# Patient Record
Sex: Male | Born: 1965 | Race: White | Hispanic: No | Smoking: Current every day smoker
Health system: Southern US, Community
[De-identification: ages and names within clinical notes are randomized; demographics above are authoritative.]

## PROBLEM LIST (undated history)

## (undated) DIAGNOSIS — F1910 Other psychoactive substance abuse, uncomplicated: Secondary | ICD-10-CM

## (undated) DIAGNOSIS — K6819 Other retroperitoneal abscess: Secondary | ICD-10-CM

## (undated) DIAGNOSIS — N200 Calculus of kidney: Secondary | ICD-10-CM

## (undated) HISTORY — PX: HERNIA REPAIR: SHX51

---

## 2001-02-16 ENCOUNTER — Encounter: Payer: Self-pay | Admitting: Emergency Medicine

## 2001-02-16 ENCOUNTER — Emergency Department (HOSPITAL_COMMUNITY): Admission: EM | Admit: 2001-02-16 | Discharge: 2001-02-16 | Payer: Self-pay | Admitting: Emergency Medicine

## 2006-11-13 ENCOUNTER — Emergency Department (HOSPITAL_COMMUNITY): Admission: EM | Admit: 2006-11-13 | Discharge: 2006-11-13 | Payer: Self-pay | Admitting: Emergency Medicine

## 2008-08-22 ENCOUNTER — Inpatient Hospital Stay (HOSPITAL_COMMUNITY): Admission: EM | Admit: 2008-08-22 | Discharge: 2008-08-25 | Payer: Self-pay | Admitting: Emergency Medicine

## 2011-03-08 NOTE — Group Therapy Note (Signed)
NAMEABIJAH, ROUSSEL             ACCOUNT NO.:  [id number]   MEDICAL RECORD NO.:  [id number]          PATIENT TYPE:  INP   LOCATION:  A323                          FACILITY:  [facility]   PHYSICIAN:  Skeet Latch, DO    DATE OF BIRTH:  [birth year]   DATE OF PROCEDURE:  08/23/2008  DATE OF DISCHARGE:                                 PROGRESS NOTE   SUBJECTIVE:  Mr. [name] states that he is feeling much better.  The  patient has decreased shortness of breath, decreased body aches, seems  to be doing well at this time.  The patient denies any headache, fever  or chills at this time.   OBJECTIVE:  VITAL SIGNS:  Temperature 97.9, pulse 78, respirations 20,  blood pressure 123/61.  CARDIOVASCULAR:  Regular rate and rhythm.  No murmurs, rubs, or gallops.  His lungs are clear to auscultation bilaterally.  No rales, rhonchi or  wheezing.  His abdomen is soft, nontender, nondistended.  Positive bowel sounds.  No rigidity or guarding.  EXTREMITIES:  No clubbing, cyanosis or edema.   LABS:  Phosphorous is 1.8.  Magnesium is 1.9.  Sodium 135, potassium  4.5, chloride 103, C02 is 27, glucose 150, BUN 11, creatinine 076.   ASSESSMENT AND PLAN:  1. Pneumonia.  Will continue with intravenous antibiotics as well as      breathing treatments and intravenous steroids.  Will try to wean      his steroids as soon as possible.  Awaiting sputum cultures at this      time.  Will get a repeat chest x-ray in the next 24-48 hours.  2. For his leukocytosis, probably secondary to his pneumonia, will      continue to follow closely.  3. For his chronic pain, the patient continues to be on intravenous      pain medications.  These seem be controlling his pain at this time.      Skeet Latch, DO  Electronically Signed     SM/MEDQ  D:  08/23/2008  T:  08/23/2008  Job:  [id number]

## 2011-03-08 NOTE — H&P (Signed)
[name], [name]             ACCOUNT NO.:  [id number]   MEDICAL RECORD NO.:  [id number]          PATIENT TYPE:  INP   LOCATION:  A323                          FACILITY:  [facility]   PHYSICIAN:  Skeet Latch, DO    DATE OF BIRTH:  1966-01-25   DATE OF ADMISSION:  08/22/2008  DATE OF DISCHARGE:  LH                              HISTORY & PHYSICAL   CHIEF COMPLAINT:  Altered mental status and fever.   HISTORY OF PRESENT ILLNESS:  This is a 45 year old Caucasian male who  presents with fever, mental status changes, bodies aches and cough.  The  patient states that last night he became acutely febrile, had some  fatigue, malaise and chills, cough.  The patient states that his  girlfriend states that the  patient had some mental status changes and  decided to bring him in to be evaluated.   PAST MEDICAL HISTORY:  Gout and chronic pain.   PAST SURGICAL HISTORY:  Umbilical hernia repair.   ALLERGIES:  NO KNOWN DRUG ALLERGIES.   PAST FAMILY HISTORY:  Unremarkable.   SOCIAL HISTORY:  He smokes 1 pack a day for over 30 years.  No history  of alcohol or drug use.   HOME MEDICATIONS:  Percocet 4 times a day, indomethacin p.r.n.   REVIEW OF SYSTEMS:  CONSTITUTIONAL:  Positive for fever, chills,  decreased appetite, weakness.  HEENT: Unremarkable.  CARDIOVASCULAR:  No chest pain, palpitations.  RESPIRATORY:  Positive for cough.  No shortness of breath.  GASTROINTESTINAL: No nausea, vomiting, diarrhea, no abdominal pain.  GENITOURINARY:  Unremarkable.  MUSCULOSKELETAL:  Positive for chronic pain.  SKIN:  Unremarkable.  NEUROLOGIC:  Positive for some altered mental status changes.  All other systems are unremarkable.   PHYSICAL EXAMINATION:  VITAL SIGNS: Temperature is 97.9, pulse 78,  respirations 20, blood pressure 123/61.  GENERAL:  He is awake and alert, well-developed, well nourished, well-  hydrated, no acute distress.  HEENT: Head atraumatic, normocephalic.  PERLA.  EOMI.  NECK:  Soft, supple, nontender, nondistended.  CARDIOVASCULAR:  Regular rate and rhythm.  No murmurs, rubs or gallops.  LUNGS:  Clear auscultation bilaterally.  No rales, rhonchi or wheezes.  ABDOMEN:  Soft, nontender, nondistended.  Positive bowel sounds.  No  rigidity or guarding.  EXTREMITIES:  No clubbing, cyanosis or edema.  NEUROLOGIC:  He is alert and oriented x3.  Cranial II-XII are grossly  intact.   LABORATORY DATA:  Phosphorus is 1.8, magnesium 1.9.  Sodium 135,  potassium 4.5, chloride 103, CO2 27, glucose 150, BUN 11, creatinine  0.76.  Influenza A and B screens are negative.  Urinalysis is  unremarkable. White count 18,100, hemoglobin 14.2, hematocrit 41,  platelet count 296.   RADIOLOGIC STUDIES:  Chest x-ray showed bilateral lower lobe infiltrates  right greater than left, compatible with pneumonia.   ASSESSMENT:  1. Pneumonia.  2. History of chronic pain.  3. Flu-like symptoms.   PLAN:  1. The patient will be admitted to the service of [facility], to a      general medical bed.  2. For his pneumonia, the patient be placed  on IV antibiotics.  3. We will obtain sputum cultures and sensitivities.  Also the patient      will placed on IV steroids and will receive nebulizing treatments      round-the-clock.  4. Chronic pain.  Will put the patient on IV pain medications ever 4      hours as needed for pain at this time.  5. The patient will be on DVT as well as GI prophylaxis.      Skeet Latch, DO  Electronically Signed     SM/MEDQ  D:  08/23/2008  T:  08/23/2008  Job:  [id number]

## 2011-03-08 NOTE — Discharge Summary (Signed)
[name], [name]             ACCOUNT NO.:  [id number]   MEDICAL RECORD NO.:  [id number]          PATIENT TYPE:  INP   LOCATION:  A323                          FACILITY:  [facility]   PHYSICIAN:  Skeet Latch, DO    DATE OF BIRTH:  03/11/1966   DATE OF ADMISSION:  08/22/2008  DATE OF DISCHARGE:  11/02/2009LH                               DISCHARGE SUMMARY   DISCHARGE DIAGNOSES:  1. Pneumonia.  2. Leukocytosis, resolving.  3. History of chronic pain.  4. Flu-like symptoms, improved.  5. Septicemia with Gram-positive cocci.   BRIEF HOSPITAL COURSE:  This is a 45 year old Caucasian male who  presented with fever, mental status changes  __________ previous to  being admitted complaining of fatigue, malaise, chills, and cough.  His  girlfriend stated the patient has some mental status changes that  morning and decided to bring the patient in to be evaluated.  The  patient was admitted and found to have bilateral lower lobe infiltrate  right greater than left compatible with pneumonia with chest x-ray.  The  patient was started on IV antibiotics, IV steroids, and received  nebulizing treatments and oxygen via nasal cannula.  The patient was  continued on these medications and has been improving.  The patient's  weakness, fever, chills resolved.  The patient's shortness of breath  also improved.  The patient's repeat chest x-ray done on August 24, 2008, showed improving bilateral airspace infiltrates, questionable  development of infiltrate in the right mid lung.  The patient's Solu-  Medrol was discontinued, started on oral prednisone and Levaquin orally  was added to his daily medications.  At this time, the patient was  stable enough to be discharged to home with close followup with his  primary care physician.   MEDICATIONS ON DISCHARGE:  Include:  1. Percocet 5/325 mg q.6 h. as needed.  2. Indomethacin previous dose as needed for gout attacks.  3. Levaquin 750 mg p.o. for 7  days.  4. Medrol dose pack #1 as directed.  5. Mucinex 600 mg twice a day as needed for cough.   VITALS ON DISCHARGE:  Temperature is 98.2, respirations 20, heart rate  75, and blood pressure 118/69, and sating 98% on room air.   LABORATORY DATA:  Hemoglobin of 12.7, hematocrit 37.8, white count is  11.9, platelets 261,000, sodium 138, potassium 4.2, chloride 102, CO2  27, BUN 8, creatinine 0.78, glucose 140, and calcium 8.6.  Hemoglobin  A1c was 5.6.   CONDITION ON DISCHARGE:  Stable.   DISPOSITION:  The patient will be discharged to home.   DISCHARGE INSTRUCTIONS:  The patient is to maintain a regular diet.  The  patient is to increase his activity slowly.  The patient is to follow up  with Dr. Jorene Guest within 1 week.  The patient may need a followup chest  x-ray in the next 7 to 10 days also.  The patient is to take his  medications as instructed to return to emergency room for any severe  shortness of breath,  __________ call [phone].      Skeet Latch, DO  Electronically Signed     SM/MEDQ  D:  08/25/2008  T:  08/26/2008  Job:  [id number]

## 2011-03-11 NOTE — Discharge Summary (Signed)
[name], [name]             ACCOUNT NO.:  [id number]   MEDICAL RECORD NO.:  [id number]          PATIENT TYPE:  INP   LOCATION:  A323                          FACILITY:  [facility]   PHYSICIAN:  Osvaldo Shipper, MD     DATE OF BIRTH:  12/11/1965   DATE OF ADMISSION:  08/22/2008  DATE OF DISCHARGE:  11/02/2009LH                               DISCHARGE SUMMARY   ADDENDUM:  The patient was discharged recently from [facility] by Dr  Lilian Kapur.   I received a call today that the patient is unable to afford the  Levaquin that was ordered for 7 days.  He was prescribed Levaquin 750 mg  daily for 7 days.  The patient apparently does not have insurance and is  unable to afford this medication, and so he called asking if an  alternative $4 [employer] [facility] prescription can be written.  I went over  the [employer] list and the closest that would come is amoxicillin.  So, I  called in a prescription for amoxicillin 500 mg t.i.d. for 7 days.  The  patient is not allergic to penicillin.      Osvaldo Shipper, MD  Electronically Signed     GK/MEDQ  D:  08/27/2008  T:  08/28/2008  Job:  [id number]

## 2011-03-24 ENCOUNTER — Emergency Department (HOSPITAL_COMMUNITY)
Admission: EM | Admit: 2011-03-24 | Discharge: 2011-03-24 | Disposition: A | Discharge status: Home / Self Care | Payer: Self-pay | Attending: Emergency Medicine | Admitting: Emergency Medicine

## 2011-03-24 DIAGNOSIS — M109 Gout, unspecified: Secondary | ICD-10-CM | POA: Insufficient documentation

## 2011-03-24 DIAGNOSIS — M79609 Pain in unspecified limb: Secondary | ICD-10-CM | POA: Insufficient documentation

## 2011-03-24 DIAGNOSIS — M25519 Pain in unspecified shoulder: Secondary | ICD-10-CM | POA: Insufficient documentation

## 2011-03-24 DIAGNOSIS — F17200 Nicotine dependence, unspecified, uncomplicated: Secondary | ICD-10-CM | POA: Insufficient documentation

## 2011-07-25 LAB — URINALYSIS, ROUTINE W REFLEX MICROSCOPIC
Bilirubin Urine: NEGATIVE
Glucose, UA: NEGATIVE
Hgb urine dipstick: NEGATIVE
Ketones, ur: NEGATIVE
Nitrite: NEGATIVE
Protein, ur: NEGATIVE
Specific Gravity, Urine: 1.01
Urobilinogen, UA: 0.2
pH: 6.5

## 2011-07-25 LAB — BASIC METABOLIC PANEL
BUN: 11
BUN: 16
CO2: 29
Chloride: 100
Chloride: 103
Creatinine, Ser: 0.76
GFR calc non Af Amer: >60
Glucose, Bld: 116 — ABNORMAL HIGH
Glucose, Bld: 150 — ABNORMAL HIGH
Potassium: 4.1
Potassium: 4.5
Sodium: 132 — ABNORMAL LOW

## 2011-07-25 LAB — CBC
HCT: 38.3 — ABNORMAL LOW
HCT: 39.9
HCT: 41
Hemoglobin: 13
Hemoglobin: 13.2
Hemoglobin: 14.2
MCHC: 33.1
MCHC: 33.8
MCHC: 34.5
MCV: 90.8
MCV: 91.2
Platelets: 296
RBC: 4.2 — ABNORMAL LOW
RBC: 4.29
RDW: 13.6
RDW: 13.6
RDW: 13.9

## 2011-07-25 LAB — CULTURE, BLOOD (ROUTINE X 2): Report Status: 11042009

## 2011-07-25 LAB — DIFFERENTIAL
Basophils Absolute: 0
Basophils Absolute: 0
Basophils Relative: 0
Basophils Relative: 0
Basophils Relative: 0
Eosinophils Absolute: 0
Eosinophils Absolute: 0.1
Eosinophils Relative: 0
Eosinophils Relative: 0
Eosinophils Relative: 1
Lymphocytes Relative: 8 — ABNORMAL LOW
Monocytes Absolute: 0.2
Monocytes Absolute: 0.4
Monocytes Absolute: 1.3 — ABNORMAL HIGH
Monocytes Relative: 2 — ABNORMAL LOW
Monocytes Relative: 3
Neutro Abs: 13.2 — ABNORMAL HIGH

## 2011-07-25 LAB — CULTURE, RESPIRATORY W GRAM STAIN

## 2011-07-25 LAB — INFLUENZA A+B VIRUS AG-DIRECT(RAPID): Influenza B Ag: NEGATIVE

## 2011-07-26 LAB — HEMOGLOBIN A1C: Mean Plasma Glucose: 114

## 2011-07-26 LAB — CBC
HCT: 37.8 — ABNORMAL LOW
MCHC: 33.7
MCV: 93
Platelets: 261
Platelets: 281
RDW: 13.9
WBC: 11.9 — ABNORMAL HIGH

## 2011-07-26 LAB — BASIC METABOLIC PANEL
BUN: 10
BUN: 8
CO2: 27
Calcium: 8.7
Chloride: 102
Creatinine, Ser: 0.68
Creatinine, Ser: 0.78
GFR calc non Af Amer: >60
Glucose, Bld: 142 — ABNORMAL HIGH
Potassium: 4.4

## 2011-07-26 LAB — DIFFERENTIAL
Basophils Absolute: 0
Basophils Absolute: 0
Basophils Relative: 0
Eosinophils Absolute: 0
Eosinophils Relative: 0
Lymphocytes Relative: 11 — ABNORMAL LOW
Neutrophils Relative %: 76
Neutrophils Relative %: 82 — ABNORMAL HIGH

## 2011-07-26 LAB — MAGNESIUM: Magnesium: 2

## 2011-07-26 LAB — PHOSPHORUS: Phosphorus: 3.8

## 2011-11-30 ENCOUNTER — Emergency Department (HOSPITAL_COMMUNITY)
Admission: EM | Admit: 2011-11-30 | Discharge: 2011-11-30 | Disposition: A | Discharge status: Home / Self Care | Payer: Self-pay | Attending: Emergency Medicine | Admitting: Emergency Medicine

## 2011-11-30 ENCOUNTER — Encounter (HOSPITAL_COMMUNITY): Payer: Self-pay | Admitting: *Deleted

## 2011-11-30 DIAGNOSIS — M109 Gout, unspecified: Secondary | ICD-10-CM | POA: Insufficient documentation

## 2011-11-30 DIAGNOSIS — M25473 Effusion, unspecified ankle: Secondary | ICD-10-CM | POA: Insufficient documentation

## 2011-11-30 DIAGNOSIS — M79609 Pain in unspecified limb: Secondary | ICD-10-CM | POA: Insufficient documentation

## 2011-11-30 DIAGNOSIS — M7989 Other specified soft tissue disorders: Secondary | ICD-10-CM | POA: Insufficient documentation

## 2011-11-30 DIAGNOSIS — M25476 Effusion, unspecified foot: Secondary | ICD-10-CM | POA: Insufficient documentation

## 2011-11-30 DIAGNOSIS — F17200 Nicotine dependence, unspecified, uncomplicated: Secondary | ICD-10-CM | POA: Insufficient documentation

## 2011-11-30 MED ORDER — OXYCODONE-ACETAMINOPHEN 5-325 MG PO TABS: 1.0000 | ORAL_TABLET | ORAL | Status: AC | PRN

## 2011-11-30 MED ORDER — INDOMETHACIN 25 MG PO CAPS
75.0000 mg | ORAL_CAPSULE | Freq: Once | ORAL | Status: AC
  Administered 2011-11-30: 75 mg via ORAL
  Filled 2011-11-30: qty 3

## 2011-11-30 MED ORDER — OXYCODONE-ACETAMINOPHEN 5-325 MG PO TABS
1.0000 | ORAL_TABLET | Freq: Once | ORAL | Status: AC
  Administered 2011-11-30: 1 via ORAL
  Filled 2011-11-30: qty 1

## 2011-11-30 MED ORDER — INDOMETHACIN ER 75 MG PO CPCR: 75.0000 mg | ORAL_CAPSULE | Freq: Two times a day (BID) | ORAL | Status: AC

## 2011-11-30 NOTE — ED Notes (Signed)
Pt c/o pain and swelling to his left great toe. Pt states that he has gout.

## 2011-11-30 NOTE — ED Notes (Signed)
Pt states gout flare up started  Tuesday night, pt has taken OTC medication without relief.   Pt denies having PCP or medication at home to resolve this condition.  Pt states his left great toe is in severe pain.  Pt reports eating salsa which is his antagonizing agent.

## 2011-11-30 NOTE — ED Provider Notes (Signed)
History     CSN: [id number]  Arrival date & time 11/30/11  1633   First MD Initiated Contact with Patient 11/30/11 1725      Chief Complaint  Patient presents with  . Gout    (Consider location/radiation/quality/duration/timing/severity/associated sxs/prior treatment) HPI Comments: Pain , redness and extreme PT to L 1st MTP for several days.  Identical to prev episodes of gout.  The history is provided by the patient. No language interpreter was used.    Past Medical History  Diagnosis Date  . Gout     Past Surgical History  Procedure Date  . Hernia repair     History reviewed. No pertinent family history.  History  Substance Use Topics  . Smoking status: Current Everyday Smoker    Types: Cigarettes  . Smokeless tobacco: Not on file  . Alcohol Use: No      Review of Systems  Musculoskeletal: Positive for joint swelling.  All other systems reviewed and are negative.    Allergies  Bee venom  Home Medications   Current Outpatient Rx  Name Route Sig Dispense Refill  . ACETAMINOPHEN 500 MG PO TABS Oral Take 1,000 mg by mouth as needed. For pain    . IBUPROFEN 200 MG PO TABS Oral Take 800 mg by mouth as needed. For pain      BP 130/78  Pulse 91  Temp(Src) 98.1 F (36.7 C) (Oral)  Resp 20  Ht 5\' 11"  (1.803 m)  Wt 225 lb (102.059 kg)  BMI 31.38 kg/m2  SpO2 99%  Physical Exam  Nursing note and vitals reviewed. Constitutional: He is oriented to person, place, and time. He appears well-developed and well-nourished.  HENT:  Head: Normocephalic and atraumatic.  Eyes: EOM are normal.  Neck: Normal range of motion.  Cardiovascular: Normal rate, regular rhythm, normal heart sounds and intact distal pulses.   Pulmonary/Chest: Effort normal and breath sounds normal. No respiratory distress.  Abdominal: Soft. He exhibits no distension. There is no tenderness.  Musculoskeletal: He exhibits tenderness.       Left foot: He exhibits decreased range of motion,  tenderness, bony tenderness and swelling.       Feet:  Neurological: He is alert and oriented to person, place, and time.  Skin: Skin is warm and dry.  Psychiatric: He has a normal mood and affect. Judgment normal.    ED Course  Procedures (including critical care time)  Labs Reviewed - No data to display No results found.   No diagnosis found.    MDM          Worthy Rancher, PA 11/30/11 [phone]

## 2011-12-01 NOTE — ED Provider Notes (Signed)
Medical screening examination/treatment/procedure(s) were performed by non-physician practitioner and as supervising physician I was immediately available for consultation/collaboration.  Lelia Jons S. Ala Capri, MD 12/01/11 1506 

## 2012-04-29 ENCOUNTER — Encounter (HOSPITAL_COMMUNITY): Payer: Self-pay

## 2012-04-29 ENCOUNTER — Emergency Department (HOSPITAL_COMMUNITY)
Admission: EM | Admit: 2012-04-29 | Discharge: 2012-04-29 | Disposition: A | Discharge status: Home / Self Care | Payer: Self-pay | Attending: Emergency Medicine | Admitting: Emergency Medicine

## 2012-04-29 DIAGNOSIS — M109 Gout, unspecified: Secondary | ICD-10-CM | POA: Insufficient documentation

## 2012-04-29 DIAGNOSIS — F17200 Nicotine dependence, unspecified, uncomplicated: Secondary | ICD-10-CM | POA: Insufficient documentation

## 2012-04-29 DIAGNOSIS — M10062 Idiopathic gout, left knee: Secondary | ICD-10-CM

## 2012-04-29 MED ORDER — INDOMETHACIN 25 MG PO CAPS
50.0000 mg | ORAL_CAPSULE | Freq: Once | ORAL | Status: AC
  Administered 2012-04-29: 50 mg via ORAL
  Filled 2012-04-29: qty 2

## 2012-04-29 MED ORDER — HYDROCODONE-ACETAMINOPHEN 5-325 MG PO TABS
1.0000 | ORAL_TABLET | Freq: Once | ORAL | Status: AC
  Administered 2012-04-29: 1 via ORAL
  Filled 2012-04-29: qty 1

## 2012-04-29 MED ORDER — HYDROCODONE-ACETAMINOPHEN 5-325 MG PO TABS: 1.0000 | ORAL_TABLET | Freq: Four times a day (QID) | ORAL | Status: AC | PRN

## 2012-04-29 MED ORDER — INDOMETHACIN 50 MG PO CAPS: 50.0000 mg | ORAL_CAPSULE | Freq: Three times a day (TID) | ORAL | Status: DC | PRN

## 2012-04-29 NOTE — ED Notes (Signed)
Pt lying on stretcher sleeping and snoring at this time. Family at bedside.

## 2012-04-29 NOTE — ED Provider Notes (Signed)
Medical screening examination/treatment/procedure(s) were performed by non-physician practitioner and as supervising physician I was immediately available for consultation/collaboration.   Carleene Cooper III, MD 04/29/12 [phone]

## 2012-04-29 NOTE — ED Notes (Signed)
Pt c/o pain in his left knee and great toe since 3 am. Pt has history of gout. Alert and oriented x 3. Skin warm and dry. Color pink. No acute distress.

## 2012-04-29 NOTE — ED Provider Notes (Signed)
History     CSN: [id number]  Arrival date & time 04/29/12  1332   First MD Initiated Contact with Patient 04/29/12 1406      Chief Complaint  Patient presents with  . Gout    (Consider location/radiation/quality/duration/timing/severity/associated sxs/prior treatment) HPI Comments: Pt c/o L knee pain since 0300 today c/w gout.  No fever or chills.  No trauma.  Has had gout in past and states indocin works better than any other meds for him.  The history is provided by the patient. No language interpreter was used.    Past Medical History  Diagnosis Date  . Gout     Past Surgical History  Procedure Date  . Hernia repair     No family history on file.  History  Substance Use Topics  . Smoking status: Current Everyday Smoker    Types: Cigarettes  . Smokeless tobacco: Not on file  . Alcohol Use: No      Review of Systems  Constitutional: Negative for fever and chills.  Musculoskeletal:       Knee pain   All other systems reviewed and are negative.    Allergies  Bee venom  Home Medications   Current Outpatient Rx  Name Route Sig Dispense Refill  . IBUPROFEN 200 MG PO TABS Oral Take 800 mg by mouth as needed. For pain    . ACETAMINOPHEN 500 MG PO TABS Oral Take 1,000 mg by mouth as needed. For pain      BP 128/87  Pulse 78  Temp 98.4 F (36.9 C) (Oral)  Resp 20  Ht 5\' 11"  (1.803 m)  Wt 225 lb (102.059 kg)  BMI 31.38 kg/m2  SpO2 97%  Physical Exam  Nursing note and vitals reviewed. Constitutional: He is oriented to person, place, and time. He appears well-developed and well-nourished.  HENT:  Head: Normocephalic and atraumatic.  Eyes: EOM are normal.  Neck: Normal range of motion.  Cardiovascular: Normal rate, regular rhythm, normal heart sounds and intact distal pulses.   Pulmonary/Chest: Effort normal and breath sounds normal. No respiratory distress.  Abdominal: Soft. He exhibits no distension. There is no tenderness.  Musculoskeletal: He  exhibits tenderness.       Left knee: He exhibits decreased range of motion and erythema. He exhibits no swelling, no ecchymosis and no deformity.       Extreme PT even to light touch.  Neurological: He is alert and oriented to person, place, and time.  Skin: Skin is warm and dry.  Psychiatric: He has a normal mood and affect. Judgment normal.    ED Course  Procedures (including critical care time)  Labs Reviewed - No data to display No results found.   1. Idiopathic gout of left knee       MDM  No trauma.  Typical of previous gouty episodes. Indocin 50 mg TID, 30 rx-hydrocodone, 20         Evalina Field, [place] 04/29/12 1422

## 2012-04-29 NOTE — ED Notes (Signed)
Complain of pain in left knee from gout

## 2012-07-26 ENCOUNTER — Emergency Department (HOSPITAL_COMMUNITY)
Admission: EM | Admit: 2012-07-26 | Discharge: 2012-07-26 | Disposition: A | Discharge status: Home / Self Care | Payer: Self-pay | Attending: Emergency Medicine | Admitting: Emergency Medicine

## 2012-07-26 ENCOUNTER — Encounter (HOSPITAL_COMMUNITY): Payer: Self-pay | Admitting: *Deleted

## 2012-07-26 ENCOUNTER — Emergency Department (HOSPITAL_COMMUNITY): Payer: Self-pay

## 2012-07-26 DIAGNOSIS — M5412 Radiculopathy, cervical region: Secondary | ICD-10-CM | POA: Insufficient documentation

## 2012-07-26 DIAGNOSIS — F17200 Nicotine dependence, unspecified, uncomplicated: Secondary | ICD-10-CM | POA: Insufficient documentation

## 2012-07-26 DIAGNOSIS — M25519 Pain in unspecified shoulder: Secondary | ICD-10-CM | POA: Insufficient documentation

## 2012-07-26 DIAGNOSIS — Z91038 Other insect allergy status: Secondary | ICD-10-CM | POA: Insufficient documentation

## 2012-07-26 MED ORDER — OXYCODONE-ACETAMINOPHEN 5-325 MG PO TABS: 1.0000 | ORAL_TABLET | ORAL | Status: DC | PRN

## 2012-07-26 MED ORDER — OXYCODONE-ACETAMINOPHEN 5-325 MG PO TABS
2.0000 | ORAL_TABLET | Freq: Once | ORAL | Status: AC
  Administered 2012-07-26: 2 via ORAL
  Filled 2012-07-26: qty 2

## 2012-07-26 NOTE — ED Notes (Signed)
Rt shoulder pain, no injury known.

## 2012-07-26 NOTE — ED Provider Notes (Signed)
History     CSN: [id number]  Arrival date & time 07/26/12  1255   First MD Initiated Contact with Patient 07/26/12 1359      Chief Complaint  Patient presents with  . Shoulder Pain     Patient is a 46 y.o. male presenting with shoulder pain. The history is provided by the patient.  Shoulder Pain This is a new problem. The current episode started more than 2 days ago. The problem occurs constantly. The problem has been gradually worsening. Pertinent negatives include no shortness of breath. Exacerbated by: palpation and movement. Nothing relieves the symptoms. He has tried nothing for the symptoms.  pt reports right sided upper chest/right shoulder/back pain for past week Worse with some movement/breathing No fever/cough No weakness No SOB reported No trauma No weakness/numbness reported in right UE No abdominal pain He has never had this before  No redness/swelling to right UE No h/o DVT/PE No recent travel/surgery   Past Medical History  Diagnosis Date  . Gout     Past Surgical History  Procedure Date  . Hernia repair     History reviewed. No pertinent family history.  History  Substance Use Topics  . Smoking status: Current Every Day Smoker    Types: Cigarettes  . Smokeless tobacco: Not on file  . Alcohol Use: Yes     rarely      Review of Systems  Constitutional: Negative for fever.  Respiratory: Negative for shortness of breath.   Neurological: Negative for weakness.  All other systems reviewed and are negative.    Allergies  Bee venom  Home Medications   Current Outpatient Rx  Name Route Sig Dispense Refill  . ACETAMINOPHEN 500 MG PO TABS Oral Take 1,000 mg by mouth as needed. For pain    . IBUPROFEN 200 MG PO TABS Oral Take 800 mg by mouth as needed. For pain    . INDOMETHACIN 50 MG PO CAPS Oral Take 1 capsule (50 mg total) by mouth 3 (three) times daily as needed. 30 capsule 0    BP 125/89  Pulse 96  Temp 98.1 F (36.7 C) (Oral)   Resp 20  Ht 5\' 11"  (1.803 m)  Wt 225 lb (102.059 kg)  BMI 31.38 kg/m2  SpO2 99%  Physical Exam CONSTITUTIONAL: Well developed/well nourished HEAD AND FACE: Normocephalic/atraumatic EYES: EOMI/PERRL ENMT: Mucous membranes moist NECK: supple no meningeal signs SPINE:entire spine nontender.  Cervical paraspinal tenderness noted CV: S1/S2 noted, no murmurs/rubs/gallops noted LUNGS: Lungs are clear to auscultation bilaterally, no apparent distress ABDOMEN: soft, nontender, no rebound or guarding GU:no cva tenderness NEURO: Pt is awake/alert, moves all extremitiesx4. Equal power with hand grip, wrist flex/extension, elbow flex/extension, and equal power with shoulder abduction/adduction.  No focal sensory deficit is noted in either UE.   EXTREMITIES: pulses normal, full ROM, no erythema, no deformity of right shoulder he can range the shoulder.  He has some pain with movement of shoulder.  No signs of trauma No LE edema/erythema and no calf tenderness SKIN: warm, color normal PSYCH: no abnormalities of mood noted  ED Course  Procedures   Pt improved He reports most of his pain starts in right neck and radiates into right UE.  He reports that is most severe pain He has no weakness in his UE, and denies weakness at home Suspect cervical radiculopathy  MDM  Nursing notes including past medical history and social history reviewed and considered in documentation xrays reviewed and considered  Date: 07/26/2012  Rate: 90  Rhythm: normal sinus rhythm  QRS Axis: normal  Intervals: normal  ST/T Wave abnormalities: nonspecific ST changes  Conduction Disutrbances:first-degree A-V block   Narrative Interpretation:   Old EKG Reviewed: none available at time of interpretation    Joya Gaskins, MD 07/26/12 1622

## 2012-12-22 DIAGNOSIS — K6819 Other retroperitoneal abscess: Secondary | ICD-10-CM

## 2012-12-22 HISTORY — DX: Other retroperitoneal abscess: K68.19

## 2013-01-03 ENCOUNTER — Encounter (HOSPITAL_COMMUNITY): Payer: Self-pay | Admitting: *Deleted

## 2013-01-03 ENCOUNTER — Emergency Department (HOSPITAL_COMMUNITY): Payer: Self-pay

## 2013-01-03 ENCOUNTER — Ambulatory Visit (HOSPITAL_COMMUNITY)
Admit: 2013-01-03 | Discharge: 2013-01-03 | Disposition: A | Discharge status: Home / Self Care | Payer: Self-pay | Source: Ambulatory Visit | Attending: General Surgery | Admitting: General Surgery

## 2013-01-03 ENCOUNTER — Inpatient Hospital Stay (HOSPITAL_COMMUNITY)
Admission: EM | Admit: 2013-01-03 | Discharge: 2013-01-07 | DRG: 372 | Disposition: A | Discharge status: Home / Self Care | Payer: MEDICAID | Attending: General Surgery | Admitting: General Surgery

## 2013-01-03 DIAGNOSIS — N1330 Unspecified hydronephrosis: Secondary | ICD-10-CM | POA: Diagnosis present

## 2013-01-03 DIAGNOSIS — F17200 Nicotine dependence, unspecified, uncomplicated: Secondary | ICD-10-CM | POA: Diagnosis present

## 2013-01-03 DIAGNOSIS — K6812 Psoas muscle abscess: Secondary | ICD-10-CM | POA: Insufficient documentation

## 2013-01-03 DIAGNOSIS — M109 Gout, unspecified: Secondary | ICD-10-CM | POA: Diagnosis present

## 2013-01-03 DIAGNOSIS — K6819 Other retroperitoneal abscess: Secondary | ICD-10-CM

## 2013-01-03 LAB — CBC WITH DIFFERENTIAL/PLATELET
Basophils Absolute: 0 K/uL (ref 0.0–0.1)
Basophils Relative: 0 % (ref 0–1)
Eosinophils Absolute: 0.1 K/uL (ref 0.0–0.7)
Eosinophils Relative: 0 % (ref 0–5)
HCT: 30.6 % — ABNORMAL LOW (ref 39.0–52.0)
Hemoglobin: 10.2 g/dL — ABNORMAL LOW (ref 13.0–17.0)
Lymphocytes Relative: 12 % (ref 12–46)
Lymphs Abs: 2.4 K/uL (ref 0.7–4.0)
MCH: 27.6 pg (ref 26.0–34.0)
MCHC: 33.3 g/dL (ref 30.0–36.0)
MCV: 82.9 fL (ref 78.0–100.0)
Monocytes Absolute: 1.7 K/uL — ABNORMAL HIGH (ref 0.1–1.0)
Monocytes Relative: 9 % (ref 3–12)
Neutro Abs: 16.2 K/uL — ABNORMAL HIGH (ref 1.7–7.7)
Neutrophils Relative %: 79 % — ABNORMAL HIGH (ref 43–77)
Platelets: 713 K/uL — ABNORMAL HIGH (ref 150–400)
RBC: 3.69 MIL/uL — ABNORMAL LOW (ref 4.22–5.81)
RDW: 15.5 % (ref 11.5–15.5)
WBC: 20.4 K/uL — ABNORMAL HIGH (ref 4.0–10.5)

## 2013-01-03 LAB — BASIC METABOLIC PANEL WITH GFR
BUN: 7 mg/dL (ref 6–23)
CO2: 23 meq/L (ref 19–32)
Calcium: 8.6 mg/dL (ref 8.4–10.5)
Chloride: 95 meq/L — ABNORMAL LOW (ref 96–112)
Creatinine, Ser: 0.94 mg/dL (ref 0.50–1.35)
GFR calc Af Amer: >90 mL/min
GFR calc non Af Amer: >90 mL/min
Glucose, Bld: 146 mg/dL — ABNORMAL HIGH (ref 70–99)
Potassium: 3.2 meq/L — ABNORMAL LOW (ref 3.5–5.1)
Sodium: 133 meq/L — ABNORMAL LOW (ref 135–145)

## 2013-01-03 LAB — TYPE AND SCREEN: Antibody Screen: NEGATIVE

## 2013-01-03 LAB — HEPATIC FUNCTION PANEL
ALT: 35 U/L (ref 0–53)
AST: 58 U/L — ABNORMAL HIGH (ref 0–37)
Albumin: 1.9 g/dL — ABNORMAL LOW (ref 3.5–5.2)
Alkaline Phosphatase: 98 U/L (ref 39–117)
Bilirubin, Direct: <0.1 mg/dL (ref 0.0–0.3)
Total Bilirubin: 0.2 mg/dL — ABNORMAL LOW (ref 0.3–1.2)
Total Protein: 7.7 g/dL (ref 6.0–8.3)

## 2013-01-03 LAB — URINALYSIS, ROUTINE W REFLEX MICROSCOPIC
Bilirubin Urine: NEGATIVE
Glucose, UA: NEGATIVE mg/dL
Ketones, ur: NEGATIVE mg/dL
Leukocytes, UA: NEGATIVE
Nitrite: NEGATIVE
Specific Gravity, Urine: 1.015 (ref 1.005–1.030)
Urobilinogen, UA: 0.2 mg/dL (ref 0.0–1.0)
pH: 6 (ref 5.0–8.0)

## 2013-01-03 LAB — PROTIME-INR
INR: 1.22 (ref 0.00–1.49)
Prothrombin Time: 15.2 s (ref 11.6–15.2)

## 2013-01-03 LAB — APTT: aPTT: 41 s — ABNORMAL HIGH (ref 24–37)

## 2013-01-03 MED ORDER — ACETAMINOPHEN 325 MG PO TABS
650.0000 mg | ORAL_TABLET | Freq: Four times a day (QID) | ORAL | Status: DC | PRN
  Administered 2013-01-03: 650 mg via ORAL
  Filled 2013-01-03: qty 2

## 2013-01-03 MED ORDER — FENTANYL CITRATE 0.05 MG/ML IJ SOLN
INTRAMUSCULAR | Status: AC
  Filled 2013-01-03: qty 4

## 2013-01-03 MED ORDER — FENTANYL CITRATE 0.05 MG/ML IJ SOLN
INTRAMUSCULAR | Status: AC | PRN
  Administered 2013-01-03 (×3): 50 ug via INTRAVENOUS

## 2013-01-03 MED ORDER — HYDROCODONE-ACETAMINOPHEN 5-325 MG PO TABS
1.0000 | ORAL_TABLET | ORAL | Status: DC | PRN
  Administered 2013-01-04 – 2013-01-05 (×4): 2 via ORAL
  Filled 2013-01-03 (×4): qty 2

## 2013-01-03 MED ORDER — ERTAPENEM SODIUM 1 G IJ SOLR
1.0000 g | INTRAMUSCULAR | Status: DC
  Filled 2013-01-03 (×3): qty 1

## 2013-01-03 MED ORDER — IOHEXOL 300 MG/ML  SOLN
100.0000 mL | Freq: Once | INTRAMUSCULAR | Status: AC | PRN
  Administered 2013-01-03: 100 mL via INTRAVENOUS

## 2013-01-03 MED ORDER — HYDROMORPHONE HCL PF 1 MG/ML IJ SOLN
INTRAMUSCULAR | Status: AC
  Administered 2013-01-03: 1 mg
  Filled 2013-01-03: qty 1

## 2013-01-03 MED ORDER — HYDROMORPHONE HCL PF 1 MG/ML IJ SOLN
1.0000 mg | INTRAMUSCULAR | Status: DC | PRN
  Administered 2013-01-03: 2 mg via INTRAVENOUS
  Administered 2013-01-03 (×2): 1 mg via INTRAVENOUS
  Administered 2013-01-04 – 2013-01-07 (×26): 2 mg via INTRAVENOUS
  Filled 2013-01-03: qty 2
  Filled 2013-01-03: qty 1
  Filled 2013-01-03 (×12): qty 2
  Filled 2013-01-03: qty 1
  Filled 2013-01-03 (×3): qty 2
  Filled 2013-01-03: qty 1
  Filled 2013-01-03: qty 2
  Filled 2013-01-03: qty 1
  Filled 2013-01-03 (×10): qty 2

## 2013-01-03 MED ORDER — DOCUSATE SODIUM 100 MG PO CAPS
100.0000 mg | ORAL_CAPSULE | Freq: Two times a day (BID) | ORAL | Status: DC
  Administered 2013-01-03 – 2013-01-07 (×8): 100 mg via ORAL
  Filled 2013-01-03 (×12): qty 1

## 2013-01-03 MED ORDER — HYDROMORPHONE HCL PF 1 MG/ML IJ SOLN
1.0000 mg | Freq: Once | INTRAMUSCULAR | Status: AC
  Administered 2013-01-03: 1 mg via INTRAVENOUS
  Filled 2013-01-03: qty 1

## 2013-01-03 MED ORDER — ERTAPENEM SODIUM 1 G IJ SOLR: 1.0000 g | INTRAMUSCULAR | Status: DC

## 2013-01-03 MED ORDER — ONDANSETRON HCL 4 MG/2ML IJ SOLN
4.0000 mg | Freq: Four times a day (QID) | INTRAMUSCULAR | Status: DC | PRN
  Administered 2013-01-03: 4 mg via INTRAVENOUS
  Filled 2013-01-03: qty 2

## 2013-01-03 MED ORDER — FENTANYL CITRATE 0.05 MG/ML IJ SOLN
INTRAMUSCULAR | Status: AC
  Administered 2013-01-03: 100 ug via INTRAVENOUS
  Filled 2013-01-03: qty 2

## 2013-01-03 MED ORDER — ONDANSETRON HCL 4 MG/2ML IJ SOLN
4.0000 mg | Freq: Once | INTRAMUSCULAR | Status: AC
  Administered 2013-01-03: 4 mg via INTRAVENOUS
  Filled 2013-01-03: qty 2

## 2013-01-03 MED ORDER — PANTOPRAZOLE SODIUM 40 MG IV SOLR
40.0000 mg | Freq: Every day | INTRAVENOUS | Status: DC
  Administered 2013-01-03 – 2013-01-05 (×3): 40 mg via INTRAVENOUS
  Filled 2013-01-03 (×3): qty 40

## 2013-01-03 MED ORDER — FENTANYL CITRATE 0.05 MG/ML IJ SOLN
100.0000 ug | Freq: Once | INTRAMUSCULAR | Status: AC
  Administered 2013-01-03: 100 ug via INTRAVENOUS

## 2013-01-03 MED ORDER — MIDAZOLAM HCL 2 MG/2ML IJ SOLN
INTRAMUSCULAR | Status: AC | PRN
  Administered 2013-01-03: 2 mg via INTRAVENOUS
  Administered 2013-01-03: 1 mg via INTRAVENOUS

## 2013-01-03 MED ORDER — LACTATED RINGERS IV SOLN
INTRAVENOUS | Status: DC
  Administered 2013-01-03: 13:00:00 via INTRAVENOUS

## 2013-01-03 MED ORDER — FENTANYL CITRATE 0.05 MG/ML IJ SOLN
100.0000 ug | Freq: Once | INTRAMUSCULAR | Status: AC
  Administered 2013-01-03: 100 ug via INTRAVENOUS
  Filled 2013-01-03: qty 2

## 2013-01-03 MED ORDER — SODIUM CHLORIDE 0.9 % IV SOLN
Freq: Once | INTRAVENOUS | Status: AC
  Administered 2013-01-03: 08:00:00 via INTRAVENOUS

## 2013-01-03 MED ORDER — MIDAZOLAM HCL 2 MG/2ML IJ SOLN
INTRAMUSCULAR | Status: AC
  Filled 2013-01-03: qty 4

## 2013-01-03 NOTE — ED Notes (Signed)
Began hurting yesterday. This morning he woke up w/severe R flank and groin pain w/hematuria.

## 2013-01-03 NOTE — Procedures (Signed)
CT 41f retroperit perc abscess drain placement 35ml purulent out, sample for GS, C&S No complication No blood loss. See complete dictation in [facility].

## 2013-01-03 NOTE — ED Notes (Signed)
Report given to [employer] in radiology department at [facility],

## 2013-01-03 NOTE — ED Notes (Signed)
Pt continues to roll around on bed c/o pain, states that the previous pain medication did not help at all. Hobson PA notified, additional orders given

## 2013-01-03 NOTE — ED Notes (Signed)
Report given to [facility] on third floor,

## 2013-01-03 NOTE — ED Notes (Signed)
[facility] here to transport pt. °

## 2013-01-03 NOTE — ED Notes (Signed)
Pt resting with eyes closed. resp even and non labored.

## 2013-01-03 NOTE — ED Notes (Addendum)
Contacted ct who advised that they would get pt as soon as possible,  Hobson, PA and pt updated. Pt requesting additional pain medication, [facility]son PA notified, additional orders given

## 2013-01-03 NOTE — Progress Notes (Signed)
Aware of request for IR to perc drain retroperitoneal abscess. Images reviewed with Dr. Deanne Coffer and agrees that collection is amenable to perc drainage. Communicated with Dr. Leticia Penna. Will try to have [facility] bring pt down here to [facility] for procedure. Labs reviewed, all ok. Pt has been NPO since early this am. Will see and consent upon arrival.

## 2013-01-03 NOTE — ED Notes (Signed)
See paper cobra form for signature.

## 2013-01-03 NOTE — ED Notes (Signed)
Hobson PA in room with pt

## 2013-01-03 NOTE — H&P (Signed)
Referring Physician:Dr. Leticia Penna HPI: [name] is an 47 y.o. male who presented to [facility] with c/o back and groin pain. His workup finds a large right psoas abscess with associated rt hydronephrosis. IR was asked to eval for possible drainage. He has now been transported to [facility] hospital for CT guided perc drainage. PMHx and meds reviewed.  Past Medical History:  Past Medical History  Diagnosis Date  . Gout     Past Surgical History:  Past Surgical History  Procedure Laterality Date  . Hernia repair      Family History: No family history on file.  Social History:  reports that he has been smoking Cigarettes.  He has been smoking about 0.00 packs per day. He does not have any smokeless tobacco history on file. He reports that  drinks alcohol. He reports that he does not use illicit drugs.  Allergies:  Allergies  Allergen Reactions  . Bee Venom Shortness Of Breath and Swelling    Medications: None  Please HPI for pertinent positives, otherwise complete 10 system ROS negative.  Physical Exam: Blood pressure 129/97, pulse 89, temperature 98.9 F (37.2 C), temperature source Oral, resp. rate 20, height 5\' 11"  (1.803 m), weight 220 lb (99.791 kg), SpO2 97.00%. Body mass index is 30.7 kg/(m^2).   General Appearance:  Alert, cooperative, appears in mod-severe distress/pain  Head:  Normocephalic, without obvious abnormality, atraumatic  ENT: Unremarkable  Neck: Supple, symmetrical, trachea midline.  Lungs:   Clear to auscultation bilaterally, no w/r/r, respirations unlabored without use of accessory muscles.  Heart:  Regular rate and rhythm, S1, S2 normal, no murmur, rub or gallop.   Abdomen:   Soft, non-tender, non distended. Tender right abdomen and flank.   Neurologic: Normal affect, no gross deficits.   Results for orders placed during the hospital encounter of 01/03/13 (from the past 48 hour(s))  URINALYSIS, ROUTINE W REFLEX MICROSCOPIC     Status:  Abnormal   Collection Time    01/03/13  8:08 AM      Result Value Range   Color, Urine YELLOW  YELLOW   APPearance CLEAR  CLEAR   Specific Gravity, Urine 1.015  1.005 - 1.030   pH 6.0  5.0 - 8.0   Glucose, UA NEGATIVE  NEGATIVE mg/dL   Hgb urine dipstick LARGE (*) NEGATIVE   Bilirubin Urine NEGATIVE  NEGATIVE   Ketones, ur NEGATIVE  NEGATIVE mg/dL   Protein, ur TRACE (*) NEGATIVE mg/dL   Urobilinogen, UA 0.2  0.0 - 1.0 mg/dL   Nitrite NEGATIVE  NEGATIVE   Leukocytes, UA NEGATIVE  NEGATIVE  URINE MICROSCOPIC-ADD ON     Status: Abnormal   Collection Time    01/03/13  8:08 AM      Result Value Range   RBC / HPF TOO NUMEROUS TO COUNT  <3 RBC/hpf   Bacteria, UA FEW (*) RARE  CBC WITH DIFFERENTIAL     Status: Abnormal   Collection Time    01/03/13  9:22 AM      Result Value Range   WBC 20.4 (*) 4.0 - 10.5 K/uL   RBC 3.69 (*) 4.22 - 5.81 MIL/uL   Hemoglobin 10.2 (*) 13.0 - 17.0 g/dL   HCT 16.1 (*) 09.6 - 04.5 %   MCV 82.9  78.0 - 100.0 fL   MCH 27.6  26.0 - 34.0 pg   MCHC 33.3  30.0 - 36.0 g/dL   RDW 40.9  81.1 - 91.4 %   Platelets 713 (*)  150 - 400 K/uL   Neutrophils Relative 79 (*) 43 - 77 %   Neutro Abs 16.2 (*) 1.7 - 7.7 K/uL   Lymphocytes Relative 12  12 - 46 %   Lymphs Abs 2.4  0.7 - 4.0 K/uL   Monocytes Relative 9  3 - 12 %   Monocytes Absolute 1.7 (*) 0.1 - 1.0 K/uL   Eosinophils Relative 0  0 - 5 %   Eosinophils Absolute 0.1  0.0 - 0.7 K/uL   Basophils Relative 0  0 - 1 %   Basophils Absolute 0.0  0.0 - 0.1 K/uL  BASIC METABOLIC PANEL     Status: Abnormal   Collection Time    01/03/13  9:22 AM      Result Value Range   Sodium 133 (*) 135 - 145 mEq/L   Potassium 3.2 (*) 3.5 - 5.1 mEq/L   Chloride 95 (*) 96 - 112 mEq/L   CO2 23  19 - 32 mEq/L   Glucose, Bld 146 (*) 70 - 99 mg/dL   BUN 7  6 - 23 mg/dL   Creatinine, Ser 7.82  0.50 - 1.35 mg/dL   Calcium 8.6  8.4 - 95.6 mg/dL   GFR calc non Af Amer >90  >90 mL/min   GFR calc Af Amer >90  >90 mL/min   Comment:             The eGFR has been calculated     using the CKD EPI equation.     This calculation has not been     validated in all clinical     situations.     eGFR's persistently     <90 mL/min signify     possible Chronic Kidney Disease.  HEPATIC FUNCTION PANEL     Status: Abnormal   Collection Time    01/03/13  9:22 AM      Result Value Range   Total Protein 7.7  6.0 - 8.3 g/dL   Albumin 1.9 (*) 3.5 - 5.2 g/dL   AST 58 (*) 0 - 37 U/L   ALT 35  0 - 53 U/L   Alkaline Phosphatase 98  39 - 117 U/L   Total Bilirubin 0.2 (*) 0.3 - 1.2 mg/dL   Bilirubin, Direct <2.1  0.0 - 0.3 mg/dL   Indirect Bilirubin NOT CALCULATED  0.3 - 0.9 mg/dL  PROTIME-INR     Status: None   Collection Time    01/03/13 10:06 AM      Result Value Range   Prothrombin Time 15.2  11.6 - 15.2 seconds   INR 1.22  0.00 - 1.49  APTT     Status: Abnormal   Collection Time    01/03/13 10:06 AM      Result Value Range   aPTT 41 (*) 24 - 37 seconds   Comment:            IF BASELINE aPTT IS ELEVATED,     SUGGEST PATIENT RISK ASSESSMENT     BE USED TO DETERMINE APPROPRIATE     ANTICOAGULANT THERAPY.  TYPE AND SCREEN     Status: None   Collection Time    01/03/13 10:25 AM      Result Value Range   ABO/RH(D) O NEG     Antibody Screen NEG     Sample Expiration 01/06/2013     Ct Abdomen Pelvis Wo Contrast  01/03/2013  *RADIOLOGY REPORT*  Clinical Data: Right flank pain.  CT ABDOMEN AND PELVIS  WITHOUT CONTRAST  Technique:  Multidetector CT imaging of the abdomen and pelvis was performed following the standard protocol without intravenous contrast.  Comparison: None.  Findings: Linear densities in the lung bases, likely atelectasis. No effusions.  Heart is upper limits normal in size.  Liver, gallbladder, spleen, pancreas, adrenals have an unremarkable unenhanced appearance.  There is enlargement and heterogeneity throughout the right psoas muscle.  Extensive stranding around the right psoas muscle.  This could represent and  right retroperitoneal hematoma or abscess. This has mass effect on the right kidney, displacing the kidney anteriorly.  There is mild right hydronephrosis, likely related to mass effect and inflammation of the proximal right ureter.  12 mm nonobstructing stone in the mid pole of the left kidney. Left ureters decompressed.  Urinary bladder is unremarkable.  Aorta is normal caliber.  Appendix is visualized and is normal.  Large and small bowel grossly unremarkable.  No free fluid, free air or adenopathy.  No acute bony abnormality.  IMPRESSION: Enlargement of the right psoas muscle with extensive surrounding stranding.  The findings most likely represent retroperitoneal hematoma.  Abscess could have a similar appearance.  Secondary mild right hydronephrosis.  Left nephrolithiasis with a 12 mm nonobstructing mid pole stone.  These results were discussed with Dr. Clarene Duke at the time of interpretation.   Original Report Authenticated By: Charlett Nose, M.D.    Ct Abdomen Pelvis W Contrast  01/03/2013  *RADIOLOGY REPORT*  Clinical Data: Right flank pain.  Abnormal CT done the same day.  CT ABDOMEN AND PELVIS WITH CONTRAST  Technique:  Multidetector CT imaging of the abdomen and pelvis was performed following the standard protocol during bolus administration of intravenous contrast.  Contrast: OMNIPAQUE IOHEXOL 300 MG/ML  SOLN  Comparison: 01/03/2013.  Findings: Lung bases show no acute findings.  Heart size normal. No pericardial effusion.  Esophageal and juxtadiaphragmatic lymph nodes measure up to 10 mm.  Small hiatal hernia.  No pleural effusion.  Liver, gallbladder and adrenal glands are unremarkable.  There is moderate right hydronephrosis.  The right ureter is followed to the level of a heterogeneous and septated low attenuation collection in the right psoas muscle, measuring 9.0 x 8.7 cm. Cranial caudal extent measures approximate 15.8 cm.  Distal ureter appears decompressed with periureteric stranding.  A  1.4 cm stone is seen in the left kidney.  Left ureter may be minimally dilated but is not overtly hydronephrotic.  Spleen, pancreas, stomach and bowel are unremarkable.  Appendix is normal. No pathologically enlarged lymph nodes.  No free fluid.  No worrisome lytic or sclerotic lesions.  IMPRESSION:  1.  Inflammatory mass occupying the right psoas muscle is most consistent with an abscess. 2.  Moderate to severe right hydronephrosis presumably secondary to #1. 3.  Left renal stone.   Original Report Authenticated By: Leanna Battles, M.D.     Assessment/Plan Rt retroperitoneal/psoas abscess with associated hydronephrosis Discussed CT guided perc drainage, including risks, complications, and drain care afterwards. Also discussed use of sedation. Hx of IV drug use, he may require high doses of sedation meds. Labs reviewed, ok. Consent signed in chart  Brayton El PA-C 01/03/2013, 2:29 PM

## 2013-01-03 NOTE — ED Notes (Signed)
Patient is resting comfortably. 

## 2013-01-03 NOTE — ED Notes (Signed)
Patient denies pain and is resting comfortably.  

## 2013-01-03 NOTE — ED Notes (Signed)
Dr. Caesar Bookman at bedside, ,

## 2013-01-03 NOTE — ED Notes (Signed)
Pt states that his pain is "some" better but pt continues to roll around on stretcher, moaning and yelling out in pain, [place] PA notified, additional orders given

## 2013-01-03 NOTE — H&P (Signed)
[name] is an 47 y.o. male.   Chief Complaint: Back and groin pain. HPI: Patient presented to [facility] ED this AM after awakening with severe back and groin pain.  Pain radiates to Right groin and scrotum.  Noted hematuria this AM.  Some dysuria over the last week.  No odor.  Some diarrhea over the last week.  No melena or hematochezia.  Some nausea with pain but no emesis.  Questionable chills through week but patient did not take his temperature.  States prior IV drug abuse history but nothing over the last year.  States "scars/wound" on right forearm is from recent diaphoresis that he underwent to donate plasma.  No known sick contacts.  No history of colon or abdominal issues.  No trauma.  No vascular history.    Past Medical History  Diagnosis Date  . Gout     Past Surgical History  Procedure Laterality Date  . Hernia repair      No family history on file. Social History:  reports that he has been smoking Cigarettes.  He has been smoking about 0.00 packs per day. He does not have any smokeless tobacco history on file. He reports that  drinks alcohol. He reports that he does not use illicit drugs.  Allergies:  Allergies  Allergen Reactions  . Bee Venom Shortness Of Breath and Swelling     (Not in a hospital admission)  Results for orders placed during the hospital encounter of 01/03/13 (from the past 48 hour(s))  URINALYSIS, ROUTINE W REFLEX MICROSCOPIC     Status: Abnormal   Collection Time    01/03/13  8:08 AM      Result Value Range   Color, Urine YELLOW  YELLOW   APPearance CLEAR  CLEAR   Specific Gravity, Urine 1.015  1.005 - 1.030   pH 6.0  5.0 - 8.0   Glucose, UA NEGATIVE  NEGATIVE mg/dL   Hgb urine dipstick LARGE (*) NEGATIVE   Bilirubin Urine NEGATIVE  NEGATIVE   Ketones, ur NEGATIVE  NEGATIVE mg/dL   Protein, ur TRACE (*) NEGATIVE mg/dL   Urobilinogen, UA 0.2  0.0 - 1.0 mg/dL   Nitrite NEGATIVE  NEGATIVE   Leukocytes, UA NEGATIVE  NEGATIVE  URINE  MICROSCOPIC-ADD ON     Status: Abnormal   Collection Time    01/03/13  8:08 AM      Result Value Range   RBC / HPF TOO NUMEROUS TO COUNT  <3 RBC/hpf   Bacteria, UA FEW (*) RARE  CBC WITH DIFFERENTIAL     Status: Abnormal   Collection Time    01/03/13  9:22 AM      Result Value Range   WBC 20.4 (*) 4.0 - 10.5 K/uL   RBC 3.69 (*) 4.22 - 5.81 MIL/uL   Hemoglobin 10.2 (*) 13.0 - 17.0 g/dL   HCT 16.1 (*) 09.6 - 04.5 %   MCV 82.9  78.0 - 100.0 fL   MCH 27.6  26.0 - 34.0 pg   MCHC 33.3  30.0 - 36.0 g/dL   RDW 40.9  81.1 - 91.4 %   Platelets 713 (*) 150 - 400 K/uL   Neutrophils Relative 79 (*) 43 - 77 %   Neutro Abs 16.2 (*) 1.7 - 7.7 K/uL   Lymphocytes Relative 12  12 - 46 %   Lymphs Abs 2.4  0.7 - 4.0 K/uL   Monocytes Relative 9  3 - 12 %   Monocytes Absolute 1.7 (*) 0.1 -  1.0 K/uL   Eosinophils Relative 0  0 - 5 %   Eosinophils Absolute 0.1  0.0 - 0.7 K/uL   Basophils Relative 0  0 - 1 %   Basophils Absolute 0.0  0.0 - 0.1 K/uL  BASIC METABOLIC PANEL     Status: Abnormal   Collection Time    01/03/13  9:22 AM      Result Value Range   Sodium 133 (*) 135 - 145 mEq/L   Potassium 3.2 (*) 3.5 - 5.1 mEq/L   Chloride 95 (*) 96 - 112 mEq/L   CO2 23  19 - 32 mEq/L   Glucose, Bld 146 (*) 70 - 99 mg/dL   BUN 7  6 - 23 mg/dL   Creatinine, Ser 9.60  0.50 - 1.35 mg/dL   Calcium 8.6  8.4 - 45.4 mg/dL   GFR calc non Af Amer >90  >90 mL/min   GFR calc Af Amer >90  >90 mL/min   Comment:            The eGFR has been calculated     using the CKD EPI equation.     This calculation has not been     validated in all clinical     situations.     eGFR's persistently     <90 mL/min signify     possible Chronic Kidney Disease.  HEPATIC FUNCTION PANEL     Status: Abnormal   Collection Time    01/03/13  9:22 AM      Result Value Range   Total Protein 7.7  6.0 - 8.3 g/dL   Albumin 1.9 (*) 3.5 - 5.2 g/dL   AST 58 (*) 0 - 37 U/L   ALT 35  0 - 53 U/L   Alkaline Phosphatase 98  39 - 117 U/L    Total Bilirubin 0.2 (*) 0.3 - 1.2 mg/dL   Bilirubin, Direct <0.9  0.0 - 0.3 mg/dL   Indirect Bilirubin NOT CALCULATED  0.3 - 0.9 mg/dL  PROTIME-INR     Status: None   Collection Time    01/03/13 10:06 AM      Result Value Range   Prothrombin Time 15.2  11.6 - 15.2 seconds   INR 1.22  0.00 - 1.49  APTT     Status: Abnormal   Collection Time    01/03/13 10:06 AM      Result Value Range   aPTT 41 (*) 24 - 37 seconds   Comment:            IF BASELINE aPTT IS ELEVATED,     SUGGEST PATIENT RISK ASSESSMENT     BE USED TO DETERMINE APPROPRIATE     ANTICOAGULANT THERAPY.  TYPE AND SCREEN     Status: None   Collection Time    01/03/13 10:25 AM      Result Value Range   ABO/RH(D) O NEG     Antibody Screen NEG     Sample Expiration 01/06/2013     Ct Abdomen Pelvis Wo Contrast  01/03/2013  *RADIOLOGY REPORT*  Clinical Data: Right flank pain.  CT ABDOMEN AND PELVIS WITHOUT CONTRAST  Technique:  Multidetector CT imaging of the abdomen and pelvis was performed following the standard protocol without intravenous contrast.  Comparison: None.  Findings: Linear densities in the lung bases, likely atelectasis. No effusions.  Heart is upper limits normal in size.  Liver, gallbladder, spleen, pancreas, adrenals have an unremarkable unenhanced appearance.  There is enlargement and heterogeneity  throughout the right psoas muscle.  Extensive stranding around the right psoas muscle.  This could represent and right retroperitoneal hematoma or abscess. This has mass effect on the right kidney, displacing the kidney anteriorly.  There is mild right hydronephrosis, likely related to mass effect and inflammation of the proximal right ureter.  12 mm nonobstructing stone in the mid pole of the left kidney. Left ureters decompressed.  Urinary bladder is unremarkable.  Aorta is normal caliber.  Appendix is visualized and is normal.  Large and small bowel grossly unremarkable.  No free fluid, free air or adenopathy.  No  acute bony abnormality.  IMPRESSION: Enlargement of the right psoas muscle with extensive surrounding stranding.  The findings most likely represent retroperitoneal hematoma.  Abscess could have a similar appearance.  Secondary mild right hydronephrosis.  Left nephrolithiasis with a 12 mm nonobstructing mid pole stone.  These results were discussed with Dr. Clarene Duke at the time of interpretation.   Original Report Authenticated By: Charlett Nose, M.D.    Ct Abdomen Pelvis W Contrast  01/03/2013  *RADIOLOGY REPORT*  Clinical Data: Right flank pain.  Abnormal CT done the same day.  CT ABDOMEN AND PELVIS WITH CONTRAST  Technique:  Multidetector CT imaging of the abdomen and pelvis was performed following the standard protocol during bolus administration of intravenous contrast.  Contrast: OMNIPAQUE IOHEXOL 300 MG/ML  SOLN  Comparison: 01/03/2013.  Findings: Lung bases show no acute findings.  Heart size normal. No pericardial effusion.  Esophageal and juxtadiaphragmatic lymph nodes measure up to 10 mm.  Small hiatal hernia.  No pleural effusion.  Liver, gallbladder and adrenal glands are unremarkable.  There is moderate right hydronephrosis.  The right ureter is followed to the level of a heterogeneous and septated low attenuation collection in the right psoas muscle, measuring 9.0 x 8.7 cm. Cranial caudal extent measures approximate 15.8 cm.  Distal ureter appears decompressed with periureteric stranding.  A 1.4 cm stone is seen in the left kidney.  Left ureter may be minimally dilated but is not overtly hydronephrotic.  Spleen, pancreas, stomach and bowel are unremarkable.  Appendix is normal. No pathologically enlarged lymph nodes.  No free fluid.  No worrisome lytic or sclerotic lesions.  IMPRESSION:  1.  Inflammatory mass occupying the right psoas muscle is most consistent with an abscess. 2.  Moderate to severe right hydronephrosis presumably secondary to #1. 3.  Left renal stone.   Original Report  Authenticated By: Leanna Battles, M.D.     Review of Systems  Constitutional: Positive for chills, malaise/fatigue and diaphoresis. Negative for fever and weight loss.  HENT: Negative.   Eyes: Negative.   Respiratory: Negative.   Cardiovascular: Negative.   Gastrointestinal: Positive for diarrhea. Negative for heartburn, nausea, vomiting and blood in stool.  Genitourinary: Positive for dysuria and hematuria.  Musculoskeletal: Positive for back pain.  Skin: Negative.   Neurological: Negative.  Negative for weakness.  Endo/Heme/Allergies: Negative.   Psychiatric/Behavioral: Negative.     Blood pressure 120/72, pulse 79, temperature 98.7 F (37.1 C), temperature source Oral, resp. rate 20, height 5\' 11"  (1.803 m), weight 99.791 kg (220 lb), SpO2 97.00%. Physical Exam  Constitutional: He is oriented to person, place, and time. He appears well-developed.  Uncomfortable.  Trashing in bed.  "can't get comfortable"  Slightly disheveled  HENT:  Head: Normocephalic and atraumatic.  Eyes: Conjunctivae and EOM are normal. Pupils are equal, round, and reactive to light. No scleral icterus.  Neck: Normal range of motion. Neck supple.  No tracheal deviation present. No thyromegaly present.  Cardiovascular: Regular rhythm.   Tachy rate  Respiratory: Effort normal and breath sounds normal. No respiratory distress.  GI: Soft. He exhibits no distension and no mass. There is no tenderness. There is no rebound and no guarding.  Musculoskeletal: He exhibits tenderness (Right back and flank).  Lymphadenopathy:    He has no cervical adenopathy.  Neurological: He is alert and oriented to person, place, and time.  Skin: Skin is warm and dry.  Thickened skin and inflammatory changes both anticubital regions.  Tattoo right arm.       Assessment/Plan Right retroperitoneal abscess.  Discussed with patient findings.  No clear primary source for the abscess however given history, urinary, colonic, or blood  bourne would all be potential sources.  Even if patient not currently using IV drugs, he may have valve vegetations as a source and will likely benefit from echo once abscess addressed.  Patient will likely benefit from IR drainage although surgical indications have been discussed with patient.  Will continue IV fluid.  IV abx.  Pain control.  Will hold DVT prophylaxis until IR has evaluated and hopefully treated.  Patient understands.   ZIEGLER,BRENT C 01/03/2013, 12:40 PM

## 2013-01-03 NOTE — ED Notes (Signed)
Pt states that the pain is starting to return,  

## 2013-01-03 NOTE — ED Provider Notes (Signed)
History     CSN: [id number]  Arrival date & time 01/03/13  4098   First MD Initiated Contact with Patient 01/03/13 [phone]      Chief Complaint  Patient presents with  . Groin Pain  . Flank Pain  . Hematuria    (Consider location/radiation/quality/duration/timing/severity/associated sxs/prior treatment) Patient is a 47 y.o. male presenting with flank pain and hematuria. The history is provided by the patient.  Flank Pain This is a new problem. The current episode started yesterday. The problem occurs constantly. The problem has been gradually worsening (No hx of trauma.). Associated symptoms include abdominal pain, arthralgias and nausea. Pertinent negatives include no chest pain, coughing, fever or neck pain. The symptoms are aggravated by bending (change of position). He has tried nothing for the symptoms. The treatment provided no relief.  Hematuria Associated symptoms include abdominal pain, arthralgias and nausea. Pertinent negatives include no chest pain, coughing, fever or neck pain.    Past Medical History  Diagnosis Date  . Gout     Past Surgical History  Procedure Laterality Date  . Hernia repair      No family history on file.  History  Substance Use Topics  . Smoking status: Current Every Day Smoker    Types: Cigarettes  . Smokeless tobacco: Not on file  . Alcohol Use: Yes     Comment: rarely      Review of Systems  Constitutional: Negative for fever and activity change.       All ROS Neg except as noted in HPI  HENT: Negative for nosebleeds and neck pain.   Eyes: Negative for photophobia and discharge.  Respiratory: Negative for cough, shortness of breath and wheezing.   Cardiovascular: Negative for chest pain and palpitations.  Gastrointestinal: Positive for nausea and abdominal pain. Negative for blood in stool.  Genitourinary: Positive for dysuria, hematuria and flank pain. Negative for frequency.  Musculoskeletal: Positive for arthralgias.  Negative for back pain.  Skin: Negative.   Neurological: Negative for dizziness, seizures and speech difficulty.  Psychiatric/Behavioral: Negative for hallucinations and confusion.    Allergies  Bee venom  Home Medications   Current Outpatient Rx  Name  Route  Sig  Dispense  Refill  . oxyCODONE-acetaminophen (PERCOCET/ROXICET) 5-325 MG per tablet   Oral   Take 1 tablet by mouth every 4 (four) hours as needed for pain.   15 tablet   0     BP 131/74  Pulse 114  Temp(Src) 98.7 F (37.1 C) (Oral)  Resp 22  Ht 5\' 11"  (1.803 m)  Wt 220 lb (99.791 kg)  BMI 30.7 kg/m2  SpO2 98%  Physical Exam  Nursing note and vitals reviewed. Constitutional: He is oriented to person, place, and time. He appears well-developed and well-nourished.  Non-toxic appearance. He appears ill. He appears distressed.  HENT:  Head: Normocephalic.  Right Ear: Tympanic membrane and external ear normal.  Left Ear: Tympanic membrane and external ear normal.  Eyes: EOM and lids are normal. Pupils are equal, round, and reactive to light.  Neck: Normal range of motion. Neck supple. Carotid bruit is not present.  Cardiovascular: Regular rhythm, normal heart sounds, intact distal pulses and normal pulses.  Tachycardia present.   Pulmonary/Chest: Breath sounds normal. No respiratory distress.  Abdominal: Soft. Bowel sounds are normal. There is tenderness.  Right cvat. Right lower quad tenderness to palpation.  Musculoskeletal: Normal range of motion.  Lymphadenopathy:       Head (right side): No submandibular adenopathy present.  Head (left side): No submandibular adenopathy present.    He has no cervical adenopathy.  Neurological: He is alert and oriented to person, place, and time. He has normal strength. No cranial nerve deficit or sensory deficit.  Skin: Skin is warm and dry.  Psychiatric: He has a normal mood and affect. His speech is normal.    ED Course  Procedures (including critical care  time)  Labs Reviewed  URINALYSIS, ROUTINE W REFLEX MICROSCOPIC   No results found.   No diagnosis found.    MDM  Pulst ox 98% on room air. WNL by my interpretation. Pt examined and in some distress.  UA reveals TMTC rbc's and large bacteria. Old CT of 2002 reviewed, pt had a small density in the kidneys at that time.   Pt requiring more pain medication. Seems very uncomfortable.   CT scan results called by radiologist. Pt has enlargement throughout the right psoas muscle on the right. Right retroperitoneal hematoma or abscess suspected. The right kidney is being displaced anteriorly. No right side kidney stones noted. 12mm nonobstructing stone on the left.  Case discuss with Dr Leticia Penna. IV contrast CT ordered and type and screen.   IV contrast CT reveals a collection that extends caudally and measures 9 x 8.7 cm. The distal ureter is decompressed on the right with stranding. The inflammatory mass involving the right psoas is most likely related to an abscess.  Dr. Leticia Penna is in the emergency department and will assume care for this patient. Patient has been made aware of the CT findings.    Kathie Dike, PA-C 01/03/13 1236

## 2013-01-03 NOTE — ED Notes (Signed)
Pt presents to er with c/o right flank pain that radiates around to right lower quad abd area, blood in urine, pt states that he started "aching" in his flank and back area yesterday, pain became worse during the night, did hear a "drop" in the toilet when he went to use the bathroom.

## 2013-01-04 DIAGNOSIS — I517 Cardiomegaly: Secondary | ICD-10-CM

## 2013-01-04 LAB — BASIC METABOLIC PANEL
Chloride: 96 mEq/L (ref 96–112)
GFR calc Af Amer: >90 mL/min (ref >90)
GFR calc non Af Amer: 87 mL/min — ABNORMAL LOW (ref >90)
Potassium: 3.6 mEq/L (ref 3.5–5.1)
Sodium: 131 mEq/L — ABNORMAL LOW (ref 135–145)

## 2013-01-04 LAB — CBC
HCT: 29.2 % — ABNORMAL LOW (ref 39.0–52.0)
Hemoglobin: 9.7 g/dL — ABNORMAL LOW (ref 13.0–17.0)
MCHC: 33.2 g/dL (ref 30.0–36.0)
RBC: 3.51 MIL/uL — ABNORMAL LOW (ref 4.22–5.81)
WBC: 18.9 10*3/uL — ABNORMAL HIGH (ref 4.0–10.5)

## 2013-01-04 MED ORDER — PROMETHAZINE HCL 25 MG/ML IJ SOLN
12.5000 mg | Freq: Four times a day (QID) | INTRAMUSCULAR | Status: DC | PRN
  Administered 2013-01-04: 12.5 mg via INTRAVENOUS
  Administered 2013-01-04: 25 mg via INTRAVENOUS
  Administered 2013-01-04: 12.5 mg via INTRAVENOUS
  Administered 2013-01-05 – 2013-01-06 (×5): 25 mg via INTRAVENOUS
  Administered 2013-01-07: 12.5 mg via INTRAVENOUS
  Filled 2013-01-04 (×9): qty 1

## 2013-01-04 MED ORDER — SODIUM CHLORIDE 0.9 % IJ SOLN
5.0000 mL | Freq: Three times a day (TID) | INTRAMUSCULAR | Status: DC
  Administered 2013-01-04 – 2013-01-07 (×10): 5 mL

## 2013-01-04 MED ORDER — ERTAPENEM SODIUM 1 G IJ SOLR
1.0000 g | INTRAMUSCULAR | Status: DC
  Administered 2013-01-04 – 2013-01-06 (×3): 1 g via INTRAVENOUS
  Filled 2013-01-04 (×3): qty 1

## 2013-01-04 NOTE — Progress Notes (Signed)
Subjective: Sore.  Some nausea  Objective: Vital signs in last 24 hours: Temp:  [97.6 F (36.4 C)-101.4 F (38.6 C)] 101.4 F (38.6 C) (03/14 1400) Pulse Rate:  [93-100] 93 (03/14 1400) Resp:  [18] 18 (03/14 1400) BP: (115-124)/(63-68) 115/63 mmHg (03/14 1400) SpO2:  [93 %-96 %] 93 % (03/14 1400) Last BM Date: 01/03/13  Intake/Output from previous day: 03/13 0701 - 03/14 0700 In: 5  Out: 450 [Drains:450] Intake/Output this shift:    General appearance: alert and no distress GI: soft, non-tender; bowel sounds normal; no masses,  no organomegaly  Lab Results:   Recent Labs  01/03/13 0922 01/04/13 0507  WBC 20.4* 18.9*  HGB 10.2* 9.7*  HCT 30.6* 29.2*  PLT 713* 643*   BMET  Recent Labs  01/03/13 0922 01/04/13 0507  NA 133* 131*  K 3.2* 3.6  CL 95* 96  CO2 23 25  GLUCOSE 146* 103*  BUN 7 6  CREATININE 0.94 1.01  CALCIUM 8.6 8.0*   PT/INR  Recent Labs  01/03/13 1006  LABPROT 15.2  INR 1.22   ABG No results found for this basename: PHART, PCO2, PO2, HCO3,  in the last 72 hours  Studies/Results: Ct Abdomen Pelvis Wo Contrast  01/03/2013  *RADIOLOGY REPORT*  Clinical Data: Right flank pain.  CT ABDOMEN AND PELVIS WITHOUT CONTRAST  Technique:  Multidetector CT imaging of the abdomen and pelvis was performed following the standard protocol without intravenous contrast.  Comparison: None.  Findings: Linear densities in the lung bases, likely atelectasis. No effusions.  Heart is upper limits normal in size.  Liver, gallbladder, spleen, pancreas, adrenals have an unremarkable unenhanced appearance.  There is enlargement and heterogeneity throughout the right psoas muscle.  Extensive stranding around the right psoas muscle.  This could represent and right retroperitoneal hematoma or abscess. This has mass effect on the right kidney, displacing the kidney anteriorly.  There is mild right hydronephrosis, likely related to mass effect and inflammation of the  proximal right ureter.  12 mm nonobstructing stone in the mid pole of the left kidney. Left ureters decompressed.  Urinary bladder is unremarkable.  Aorta is normal caliber.  Appendix is visualized and is normal.  Large and small bowel grossly unremarkable.  No free fluid, free air or adenopathy.  No acute bony abnormality.  IMPRESSION: Enlargement of the right psoas muscle with extensive surrounding stranding.  The findings most likely represent retroperitoneal hematoma.  Abscess could have a similar appearance.  Secondary mild right hydronephrosis.  Left nephrolithiasis with a 12 mm nonobstructing mid pole stone.  These results were discussed with Dr. Clarene Duke at the time of interpretation.   Original Report Authenticated By: Charlett Nose, M.D.    Ct Abdomen Pelvis W Contrast  01/03/2013  *RADIOLOGY REPORT*  Clinical Data: Right flank pain.  Abnormal CT done the same day.  CT ABDOMEN AND PELVIS WITH CONTRAST  Technique:  Multidetector CT imaging of the abdomen and pelvis was performed following the standard protocol during bolus administration of intravenous contrast.  Contrast: OMNIPAQUE IOHEXOL 300 MG/ML  SOLN  Comparison: 01/03/2013.  Findings: Lung bases show no acute findings.  Heart size normal. No pericardial effusion.  Esophageal and juxtadiaphragmatic lymph nodes measure up to 10 mm.  Small hiatal hernia.  No pleural effusion.  Liver, gallbladder and adrenal glands are unremarkable.  There is moderate right hydronephrosis.  The right ureter is followed to the level of a heterogeneous and septated low attenuation collection in the right psoas muscle, measuring  9.0 x 8.7 cm. Cranial caudal extent measures approximate 15.8 cm.  Distal ureter appears decompressed with periureteric stranding.  A 1.4 cm stone is seen in the left kidney.  Left ureter may be minimally dilated but is not overtly hydronephrotic.  Spleen, pancreas, stomach and bowel are unremarkable.  Appendix is normal. No pathologically  enlarged lymph nodes.  No free fluid.  No worrisome lytic or sclerotic lesions.  IMPRESSION:  1.  Inflammatory mass occupying the right psoas muscle is most consistent with an abscess. 2.  Moderate to severe right hydronephrosis presumably secondary to #1. 3.  Left renal stone.   Original Report Authenticated By: Leanna Battles, M.D.    Ct Image Guided Fluid Drain By Catheter  01/03/2013  *RADIOLOGY REPORT*  Clinical data:  Right psoas abscess, pain.  CT-GUIDED RETROPERITONEAL ABSCESS DRAIN CATHETER PLACEMENT  Technique and findings: The procedure, risks (including but not limited to bleeding, infection, organ damage), benefits, and alternatives were explained to the patient.  Questions regarding the procedure were encouraged and answered.  The patient understands and consents to the procedure.Select axial scans through the lower abdomen were obtained.  The collection was localized and an appropriate skin site was determined. Operator donned sterile gloves and mask.   Site was marked, prepped with Betadine, draped in usual sterile fashion, infiltrated locally with 1% lidocaine.  Intravenous Fentanyl and Versed were administered as conscious sedation during continuous cardiorespiratory monitoring by the radiology RN, with a total moderate sedation time of 15 minutes.  Under CT fluoroscopic guidance, an 18 gauge trocar needle was advanced into the collection.  Purulent material could be aspirated.  The guide wire advanced easily within the collection, its position confirmed on CT.  The tract was dilated to allow placement of a 12-French pigtail drainage catheter, formed within the central aspect of the collection.  Approximate 35 ml of purulent material were aspirated, sample sent for Gram stain, culture and sensitivity.  The drain was attached to gravity drain bag and secured externally with O-Prolene suture and Statlock. The patient tolerated the procedure well.  No immediate complication.  IMPRESSION:  Technically successful CT-guided retroperitoneal   abscess drainage catheter placement.   Original Report Authenticated By: D. Andria Rhein, MD     Anti-infectives: Anti-infectives   Start     Dose/Rate Route Frequency Ordered Stop   01/04/13 1830  ertapenem [facility]) injection 1 g  Status:  Discontinued     1 g Intravenous Every 24 hours 01/03/13 1343 01/04/13 1208   01/04/13 1800  ertapenem (INVANZ) 1 g in sodium chloride 0.9 % 50 mL IVPB     1 g 100 mL/hr over 30 Minutes Intravenous Every 24 hours 01/04/13 1208     01/03/13 1252  ertapenem [facility]) injection 1 g  Status:  Discontinued     1 g Intramuscular Every 24 hours 01/03/13 1252 01/03/13 1343      Assessment/Plan: s/p * No surgery found * Abscess.  Continue abx coverage (IV).  Diet as tolerated.  Continue to monitor WBC.  Check echo to r/o vegitations.  LOS: 1 day    ZIEGLER,BRENT C 01/04/2013

## 2013-01-04 NOTE — Progress Notes (Signed)
*  PRELIMINARY RESULTS* Echocardiogram 2D Echocardiogram has been performed.  Conrad Palmer 01/04/2013, 4:08 PM

## 2013-01-04 NOTE — Progress Notes (Signed)
Pt requested his temperature be checked and his temperature was 102.2 oral.  Upon assessment his hot to the touch.  Dr. Leticia Penna notified via phone and new orders were given and carried out.  Nursing staff to continue to monitor.

## 2013-01-04 NOTE — Care Management Note (Signed)
    Page 1 of 1   01/07/2013     2:39:55 PM   CARE MANAGEMENT NOTE 01/07/2013  Patient:  [name], [name]   Account Number:  [id number]  Date Initiated:  01/04/2013  Documentation initiated by:  Sharrie Rothman  Subjective/Objective Assessment:   Pt admitted from home with psoas abcess. Pt currently lives alone but will be going home with his ex-wife at discharge. Pt is independent with ADL's.     Action/Plan:   Financial counselor is aware of self pay status. Pts ex wife stated that she and her mother will help pt with medications at discharge.   Anticipated DC Date:  01/07/2013   Anticipated DC Plan:  HOME/SELF CARE  In-house referral  Financial Counselor      DC Planning Services  CM consult      Choice offered to / List presented to:             Status of service:  Completed, signed off Medicare Important Message given?   (If response is "NO", the following Medicare IM given date fields will be blank) Date Medicare IM given:   Date Additional Medicare IM given:    Discharge Disposition:  HOME/SELF CARE  Per UR Regulation:    If discussed at Long Length of Stay Meetings, dates discussed:    Comments:  01/07/13 1440 Arlyss Queen, RN BSN CM Pt discharged home. No CM needs noted.  01/04/13 1350 Arlyss Queen, RN BSN CM

## 2013-01-05 LAB — BASIC METABOLIC PANEL
CO2: 26 mEq/L (ref 19–32)
Calcium: 8.4 mg/dL (ref 8.4–10.5)
Creatinine, Ser: 1.04 mg/dL (ref 0.50–1.35)
Glucose, Bld: 105 mg/dL — ABNORMAL HIGH (ref 70–99)

## 2013-01-05 LAB — CBC
MCH: 27.7 pg (ref 26.0–34.0)
MCHC: 33.3 g/dL (ref 30.0–36.0)
MCV: 83.1 fL (ref 78.0–100.0)
Platelets: 595 10*3/uL — ABNORMAL HIGH (ref 150–400)
RDW: 15.8 % — ABNORMAL HIGH (ref 11.5–15.5)
WBC: 19.5 10*3/uL — ABNORMAL HIGH (ref 4.0–10.5)

## 2013-01-05 MED ORDER — CELECOXIB 100 MG PO CAPS
200.0000 mg | ORAL_CAPSULE | Freq: Two times a day (BID) | ORAL | Status: DC
  Administered 2013-01-05 – 2013-01-07 (×5): 200 mg via ORAL
  Filled 2013-01-05 (×5): qty 2

## 2013-01-05 MED ORDER — VANCOMYCIN HCL IN DEXTROSE 1-5 GM/200ML-% IV SOLN
1000.0000 mg | Freq: Two times a day (BID) | INTRAVENOUS | Status: DC
  Administered 2013-01-05 – 2013-01-06 (×4): 1000 mg via INTRAVENOUS
  Filled 2013-01-05 (×5): qty 200

## 2013-01-05 MED ORDER — OXYCODONE-ACETAMINOPHEN 5-325 MG PO TABS
1.0000 | ORAL_TABLET | ORAL | Status: DC | PRN
  Administered 2013-01-05 – 2013-01-07 (×10): 2 via ORAL
  Filled 2013-01-05 (×5): qty 2
  Filled 2013-01-05 (×2): qty 1
  Filled 2013-01-05 (×4): qty 2

## 2013-01-05 NOTE — Progress Notes (Signed)
Subjective: Pain "poorly controlled"  Not lasting long enough.  Tolerating PO.  No fever or chills.    Objective: Vital signs in last 24 hours: Temp:  [98.5 F (36.9 C)-99.5 F (37.5 C)] 98.5 F (36.9 C) (03/15 0522) Pulse Rate:  [87-101] 87 (03/15 0522) Resp:  [18] 18 (03/15 0522) BP: (104-118)/(67) 104/67 mmHg (03/15 0522) SpO2:  [96 %] 96 % (03/15 0522) Last BM Date: 01/02/13  Intake/Output from previous day: 03/14 0701 - 03/15 0700 In: 735 [P.O.:720] Out: 2620 [Urine:2500; Drains:120] Intake/Output this shift: Total I/O In: 240 [P.O.:240] Out: -   General appearance: alert, no distress and Initially sleeping upon entering room. GI: soft, non-tender; bowel sounds normal; no masses,  no organomegaly and Perc drain with purulent discharge.    Lab Results:   Recent Labs  01/04/13 0507 01/05/13 0621  WBC 18.9* 19.5*  HGB 9.7* 9.7*  HCT 29.2* 29.1*  PLT 643* 595*   BMET  Recent Labs  01/04/13 0507 01/05/13 0621  NA 131* 131*  K 3.6 3.8  CL 96 95*  CO2 25 26  GLUCOSE 103* 105*  BUN 6 8  CREATININE 1.01 1.04  CALCIUM 8.0* 8.4   PT/INR  Recent Labs  01/03/13 1006  LABPROT 15.2  INR 1.22   ABG No results found for this basename: PHART, PCO2, PO2, HCO3,  in the last 72 hours  Studies/Results: Ct Image Guided Fluid Drain By Catheter  01/03/2013  *RADIOLOGY REPORT*  Clinical data:  Right psoas abscess, pain.  CT-GUIDED RETROPERITONEAL ABSCESS DRAIN CATHETER PLACEMENT  Technique and findings: The procedure, risks (including but not limited to bleeding, infection, organ damage), benefits, and alternatives were explained to the patient.  Questions regarding the procedure were encouraged and answered.  The patient understands and consents to the procedure.Select axial scans through the lower abdomen were obtained.  The collection was localized and an appropriate skin site was determined. Operator donned sterile gloves and mask.   Site was marked, prepped with  Betadine, draped in usual sterile fashion, infiltrated locally with 1% lidocaine.  Intravenous Fentanyl and Versed were administered as conscious sedation during continuous cardiorespiratory monitoring by the radiology RN, with a total moderate sedation time of 15 minutes.  Under CT fluoroscopic guidance, an 18 gauge trocar needle was advanced into the collection.  Purulent material could be aspirated.  The guide wire advanced easily within the collection, its position confirmed on CT.  The tract was dilated to allow placement of a 12-French pigtail drainage catheter, formed within the central aspect of the collection.  Approximate 35 ml of purulent material were aspirated, sample sent for Gram stain, culture and sensitivity.  The drain was attached to gravity drain bag and secured externally with O-Prolene suture and Statlock. The patient tolerated the procedure well.  No immediate complication.  IMPRESSION: Technically successful CT-guided retroperitoneal   abscess drainage catheter placement.   Original Report Authenticated By: D. Andria Rhein, MD     Anti-infectives: Anti-infectives   Start     Dose/Rate Route Frequency Ordered Stop   01/05/13 1400  vancomycin (VANCOCIN) IVPB 1000 mg/200 mL premix     1,000 mg 200 mL/hr over 60 Minutes Intravenous 2 times daily 01/05/13 1230     01/04/13 1830  ertapenem [facility]) injection 1 g  Status:  Discontinued     1 g Intravenous Every 24 hours 01/03/13 1343 01/04/13 1208   01/04/13 1800  ertapenem (INVANZ) 1 g in sodium chloride 0.9 % 50 mL IVPB  1 g 100 mL/hr over 30 Minutes Intravenous Every 24 hours 01/04/13 1208     01/03/13 1252  ertapenem [facility]) injection 1 g  Status:  Discontinued     1 g Intramuscular Every 24 hours 01/03/13 1252 01/03/13 1343      Assessment/Plan: s/p * No surgery found * Retroperitoneal abscess.  Gram + bacteria on intial culture.  Will add vanco.  Continue Invanz for now.  Will change pain regiment.    LOS: 2 days     Tremell Reimers C 01/05/2013

## 2013-01-06 LAB — CBC
Platelets: 512 10*3/uL — ABNORMAL HIGH (ref 150–400)
RDW: 15.5 % (ref 11.5–15.5)
WBC: 11 10*3/uL — ABNORMAL HIGH (ref 4.0–10.5)

## 2013-01-06 LAB — CULTURE, ROUTINE-ABSCESS

## 2013-01-06 LAB — BASIC METABOLIC PANEL
Calcium: 8.1 mg/dL — ABNORMAL LOW (ref 8.4–10.5)
Creatinine, Ser: 1.02 mg/dL (ref 0.50–1.35)
GFR calc Af Amer: >90 mL/min (ref >90)
GFR calc non Af Amer: 86 mL/min — ABNORMAL LOW (ref >90)

## 2013-01-06 MED ORDER — PANTOPRAZOLE SODIUM 40 MG PO TBEC
40.0000 mg | DELAYED_RELEASE_TABLET | Freq: Every day | ORAL | Status: DC
  Administered 2013-01-06: 40 mg via ORAL
  Filled 2013-01-06: qty 1

## 2013-01-06 NOTE — Progress Notes (Signed)
The patient is receiving Protonix by the intravenous route.  Based on criteria approved by the Pharmacy and Therapeutics Committee and the Medical Executive Committee, the medication is being converted to the equivalent oral dose form.  These criteria include: -No Active GI bleeding -Able to tolerate diet of full liquids (or better) or tube feeding OR able to tolerate other medications by the oral or enteral route  If you have any questions about this conversion, please contact the Pharmacy Department (ext [phone]).  Thank you.  Mady Gemma, [facility] 01/06/2013 12:36 PM

## 2013-01-06 NOTE — Progress Notes (Signed)
  Subjective: Pain better.  No fevers.  Tolerating PO.    Objective: Vital signs in last 24 hours: Temp:  [97.3 F (36.3 C)-97.4 F (36.3 C)] 97.3 F (36.3 C) (03/16 0709) Pulse Rate:  [63] 63 (03/15 2247) Resp:  [20] 20 (03/16 0709) BP: (94-100)/(59-66) 100/66 mmHg (03/16 0709) SpO2:  [94 %-98 %] 98 % (03/16 0709) Last BM Date: 01/02/13  Intake/Output from previous day: 03/15 0701 - 03/16 0700 In: 365 [P.O.:360; I.V.:5] Out: 1620 [Urine:1560; Drains:60] Intake/Output this shift: Total I/O In: 250 [P.O.:240; I.V.:5; Other:5] Out: -   General appearance: alert and no distress GI: soft, non-tender; bowel sounds normal; no masses,  no organomegaly  Lab Results:   Recent Labs  01/05/13 0621 01/06/13 0527  WBC 19.5* 11.0*  HGB 9.7* 10.0*  HCT 29.1* 30.3*  PLT 595* 512*   BMET  Recent Labs  01/05/13 0621 01/06/13 0527  NA 131* 132*  K 3.8 3.7  CL 95* 95*  CO2 26 26  GLUCOSE 105* 92  BUN 8 14  CREATININE 1.04 1.02  CALCIUM 8.4 8.1*   PT/INR No results found for this basename: LABPROT, INR,  in the last 72 hours ABG No results found for this basename: PHART, PCO2, PO2, HCO3,  in the last 72 hours  Studies/Results: No results found.  Anti-infectives: Anti-infectives   Start     Dose/Rate Route Frequency Ordered Stop   01/05/13 1400  vancomycin (VANCOCIN) IVPB 1000 mg/200 mL premix     1,000 mg 200 mL/hr over 60 Minutes Intravenous 2 times daily 01/05/13 1230     01/04/13 1830  ertapenem [facility]) injection 1 g  Status:  Discontinued     1 g Intravenous Every 24 hours 01/03/13 1343 01/04/13 1208   01/04/13 1800  ertapenem (INVANZ) 1 g in sodium chloride 0.9 % 50 mL IVPB     1 g 100 mL/hr over 30 Minutes Intravenous Every 24 hours 01/04/13 1208     01/03/13 1252  ertapenem [facility]) injection 1 g  Status:  Discontinued     1 g Intramuscular Every 24 hours 01/03/13 1252 01/03/13 1343      Assessment/Plan: s/p * No surgery found * Retropertineal  abscess.  Continue perc drainage.  COntinue Invanz, vancomycin.  Hopeful continue WBC improvement and transition to oral abx tomorrow . Possible discharge tomorrow.    LOS: 3 days    Asalee Barrette C 01/06/2013

## 2013-01-06 NOTE — ED Provider Notes (Signed)
Medical screening examination/treatment/procedure(s) were performed by non-physician practitioner and as supervising physician I was immediately available for consultation/collaboration.   Laray Anger, DO 01/06/13 1244

## 2013-01-07 LAB — BASIC METABOLIC PANEL WITH GFR
BUN: 11 mg/dL (ref 6–23)
CO2: 28 meq/L (ref 19–32)
Calcium: 8.3 mg/dL — ABNORMAL LOW (ref 8.4–10.5)
Chloride: 99 meq/L (ref 96–112)
Creatinine, Ser: 1.04 mg/dL (ref 0.50–1.35)
GFR calc Af Amer: >90 mL/min
GFR calc non Af Amer: 84 mL/min — ABNORMAL LOW
Glucose, Bld: 102 mg/dL — ABNORMAL HIGH (ref 70–99)
Potassium: 4.5 meq/L (ref 3.5–5.1)
Sodium: 135 meq/L (ref 135–145)

## 2013-01-07 LAB — CBC
Hemoglobin: 10.3 g/dL — ABNORMAL LOW (ref 13.0–17.0)
MCHC: 32.8 g/dL (ref 30.0–36.0)
Platelets: 548 10*3/uL — ABNORMAL HIGH (ref 150–400)
RBC: 3.75 MIL/uL — ABNORMAL LOW (ref 4.22–5.81)

## 2013-01-07 LAB — VANCOMYCIN, TROUGH: Vancomycin Tr: 16.4 ug/mL (ref 10.0–20.0)

## 2013-01-07 MED ORDER — SULFAMETHOXAZOLE-TRIMETHOPRIM 400-80 MG PO TABS
1.0000 | ORAL_TABLET | Freq: Two times a day (BID) | ORAL | Status: DC
  Filled 2013-01-07: qty 1

## 2013-01-07 MED ORDER — OXYCODONE-ACETAMINOPHEN 5-325 MG PO TABS: 1.0000 | ORAL_TABLET | ORAL | Status: DC | PRN

## 2013-01-07 MED ORDER — SULFAMETHOXAZOLE-TMP DS 800-160 MG PO TABS
1.0000 | ORAL_TABLET | Freq: Two times a day (BID) | ORAL | Status: DC
  Administered 2013-01-07: 1 via ORAL
  Filled 2013-01-07: qty 1

## 2013-01-07 MED ORDER — SULFAMETHOXAZOLE-TMP DS 800-160 MG PO TABS: 1.0000 | ORAL_TABLET | Freq: Two times a day (BID) | ORAL | Status: DC

## 2013-01-07 NOTE — Progress Notes (Deleted)
[facility] SURGICAL UNIT [address] [id number] [place] [address] Phone: [phone] Fax: [phone]  January 07, 2013  Patient: [name]  Date of Birth: July 04, 1966  Date of Visit: 01/03/2013-01/07/2013    To Whom It May Concern:  Keegan Ducey was seen and treated in our hospital on 01/03/2013-01/07/2013. Marland Kitchen  Sincerely,  Karl Luke, RN

## 2013-01-07 NOTE — Progress Notes (Signed)
UR Chart Review Completed  

## 2013-01-07 NOTE — Progress Notes (Signed)
Discharge instructions and prescription given, verbalized understanding, out ambulatory in stable condition with staff.

## 2013-01-11 ENCOUNTER — Encounter (HOSPITAL_COMMUNITY): Payer: Self-pay | Admitting: *Deleted

## 2013-01-11 ENCOUNTER — Emergency Department (HOSPITAL_COMMUNITY)
Admission: EM | Admit: 2013-01-11 | Discharge: 2013-01-11 | Disposition: A | Discharge status: Home / Self Care | Payer: Self-pay | Attending: Emergency Medicine | Admitting: Emergency Medicine

## 2013-01-11 ENCOUNTER — Emergency Department (HOSPITAL_COMMUNITY): Payer: Self-pay

## 2013-01-11 DIAGNOSIS — Z862 Personal history of diseases of the blood and blood-forming organs and certain disorders involving the immune mechanism: Secondary | ICD-10-CM | POA: Insufficient documentation

## 2013-01-11 DIAGNOSIS — Z8639 Personal history of other endocrine, nutritional and metabolic disease: Secondary | ICD-10-CM | POA: Insufficient documentation

## 2013-01-11 DIAGNOSIS — R109 Unspecified abdominal pain: Secondary | ICD-10-CM | POA: Insufficient documentation

## 2013-01-11 DIAGNOSIS — Z9889 Other specified postprocedural states: Secondary | ICD-10-CM | POA: Insufficient documentation

## 2013-01-11 DIAGNOSIS — G8918 Other acute postprocedural pain: Secondary | ICD-10-CM | POA: Insufficient documentation

## 2013-01-11 DIAGNOSIS — F17200 Nicotine dependence, unspecified, uncomplicated: Secondary | ICD-10-CM | POA: Insufficient documentation

## 2013-01-11 DIAGNOSIS — Z872 Personal history of diseases of the skin and subcutaneous tissue: Secondary | ICD-10-CM | POA: Insufficient documentation

## 2013-01-11 DIAGNOSIS — K6819 Other retroperitoneal abscess: Secondary | ICD-10-CM

## 2013-01-11 HISTORY — DX: Other retroperitoneal abscess: K68.19

## 2013-01-11 LAB — URINALYSIS, ROUTINE W REFLEX MICROSCOPIC
Glucose, UA: NEGATIVE mg/dL
Hgb urine dipstick: NEGATIVE
Ketones, ur: NEGATIVE mg/dL
Leukocytes, UA: NEGATIVE
Protein, ur: NEGATIVE mg/dL
Urobilinogen, UA: 0.2 mg/dL (ref 0.0–1.0)

## 2013-01-11 LAB — COMPREHENSIVE METABOLIC PANEL
ALT: 164 U/L — ABNORMAL HIGH (ref 0–53)
Alkaline Phosphatase: 291 U/L — ABNORMAL HIGH (ref 39–117)
CO2: 26 mEq/L (ref 19–32)
Chloride: 101 mEq/L (ref 96–112)
GFR calc Af Amer: 78 mL/min — ABNORMAL LOW (ref >90)
GFR calc non Af Amer: 68 mL/min — ABNORMAL LOW (ref >90)
Glucose, Bld: 93 mg/dL (ref 70–99)
Potassium: 3.9 mEq/L (ref 3.5–5.1)
Sodium: 135 mEq/L (ref 135–145)
Total Bilirubin: 0.2 mg/dL — ABNORMAL LOW (ref 0.3–1.2)

## 2013-01-11 LAB — CBC WITH DIFFERENTIAL/PLATELET
Hemoglobin: 11.1 g/dL — ABNORMAL LOW (ref 13.0–17.0)
Lymphocytes Relative: 27 % (ref 12–46)
Lymphs Abs: 2.8 10*3/uL (ref 0.7–4.0)
Monocytes Relative: 9 % (ref 3–12)
Neutrophils Relative %: 59 % (ref 43–77)
Platelets: 581 10*3/uL — ABNORMAL HIGH (ref 150–400)
RBC: 4.08 MIL/uL — ABNORMAL LOW (ref 4.22–5.81)
WBC: 10.2 10*3/uL (ref 4.0–10.5)

## 2013-01-11 MED ORDER — FENTANYL CITRATE 0.05 MG/ML IJ SOLN
100.0000 ug | INTRAMUSCULAR | Status: AC | PRN
  Administered 2013-01-11 (×2): 100 ug via INTRAVENOUS
  Filled 2013-01-11 (×2): qty 2

## 2013-01-11 MED ORDER — OXYCODONE-ACETAMINOPHEN 5-325 MG PO TABS: ORAL_TABLET | ORAL | Status: DC

## 2013-01-11 MED ORDER — FENTANYL CITRATE 0.05 MG/ML IJ SOLN
100.0000 ug | INTRAMUSCULAR | Status: DC | PRN
  Administered 2013-01-11: 100 ug via INTRAVENOUS
  Filled 2013-01-11: qty 2

## 2013-01-11 MED ORDER — IOHEXOL 300 MG/ML  SOLN
100.0000 mL | Freq: Once | INTRAMUSCULAR | Status: AC | PRN
  Administered 2013-01-11: 100 mL via INTRAVENOUS

## 2013-01-11 MED ORDER — ONDANSETRON HCL 4 MG/2ML IJ SOLN
4.0000 mg | INTRAMUSCULAR | Status: DC | PRN
  Administered 2013-01-11: 4 mg via INTRAVENOUS
  Filled 2013-01-11: qty 2

## 2013-01-11 MED ORDER — IOHEXOL 300 MG/ML  SOLN
50.0000 mL | Freq: Once | INTRAMUSCULAR | Status: AC | PRN
  Administered 2013-01-11: 50 mL via ORAL

## 2013-01-11 MED ORDER — FENTANYL CITRATE 0.05 MG/ML IJ SOLN: 50.0000 ug | INTRAMUSCULAR | Status: DC | PRN

## 2013-01-11 MED ORDER — SODIUM CHLORIDE 0.9 % IV SOLN
INTRAVENOUS | Status: DC
  Administered 2013-01-11: 15:00:00 via INTRAVENOUS

## 2013-01-11 NOTE — ED Notes (Signed)
Patient with no complaints at this time. Respirations even and unlabored. Skin warm/dry. Discharge instructions reviewed with patient at this time. Patient given opportunity to voice concerns/ask questions. IV removed per policy and band-aid applied to site. Patient discharged at this time and left Emergency Department with steady gait.  

## 2013-01-11 NOTE — ED Notes (Signed)
Irrigated R abdominal drain w/80 cc sterile saline.  Multiple fibrin clots retrieved and flow re-established w/frank bloody drainage.  Cleaned catheter entry site w/chlorhexadine x 3 then applied non-adherent gauze and tape.  Entry site is red w/purulent drainage.

## 2013-01-11 NOTE — ED Notes (Signed)
Pt d/c from here x 3 days ago with wound drainage bag from a retroperitoneal abscess.  Pt reports increased pain at drainage bag site with pus oozing from site.  Reports tube has not been draining since yesterday.

## 2013-01-11 NOTE — ED Provider Notes (Signed)
History     CSN: [id number]  Arrival date & time 01/11/13  1342   First MD Initiated Contact with Patient 01/11/13 1408      Chief Complaint  Patient presents with  . Follow-up     HPI Pt was seen at 1410.   Per pt, c/o gradual onset and worsening of persistent right sided abd "pain" for the past 2 days.  Pt states the pain is located at the site where his drainage tube enters.  Pt was discharged from the hospital 3 days ago for dx retroperitoneal abscess with drain placed by IR.  Pt states the "drain stopped draining" 2 days ago and the site itself has become "red" with "pus draining from it."  Denies fevers, no N/V/D, no CP/SOB, no bleeding.     Past Medical History  Diagnosis Date  . Gout   . Retroperitoneal abscess 12/2012    Dr. Leticia Penna    Past Surgical History  Procedure Laterality Date  . Hernia repair       History  Substance Use Topics  . Smoking status: Current Every Day Smoker    Types: Cigarettes  . Smokeless tobacco: Not on file  . Alcohol Use: Yes     Comment: rarely    Review of Systems ROS: Statement: All systems negative except as marked or noted in the HPI; Constitutional: Negative for fever and chills. ; ; Eyes: Negative for eye pain, redness and discharge. ; ; ENMT: Negative for ear pain, hoarseness, nasal congestion, sinus pressure and sore throat. ; ; Cardiovascular: Negative for chest pain, palpitations, diaphoresis, dyspnea and peripheral edema. ; ; Respiratory: Negative for cough, wheezing and stridor. ; ; Gastrointestinal: +abd pain. Negative for nausea, vomiting, diarrhea, blood in stool, hematemesis, jaundice and rectal bleeding. . ; ; Genitourinary: Negative for dysuria, flank pain and hematuria. ; ; Musculoskeletal: Negative for back pain and neck pain. Negative for swelling and trauma.; ; Skin: +rash. Negative for pruritus, abrasions, blisters, bruising and skin lesion.; ; Neuro: Negative for headache, lightheadedness and neck stiffness.  Negative for weakness, altered level of consciousness , altered mental status, extremity weakness, paresthesias, involuntary movement, seizure and syncope.       Allergies  Bee venom  Home Medications   Current Outpatient Rx  Name  Route  Sig  Dispense  Refill  . acetaminophen (TYLENOL) 500 MG tablet   Oral   Take 1,000 mg by mouth every 6 (six) hours as needed for pain.         . Aspirin-Acetaminophen-Caffeine (GOODYS EXTRA STRENGTH PO)   Oral   Take 1 Package by mouth daily as needed (fever).         Marland Kitchen oxyCODONE-acetaminophen (PERCOCET/ROXICET) 5-325 MG per tablet   Oral   Take 1-2 tablets by mouth every 4 (four) hours as needed.   45 tablet   0   . sulfamethoxazole-trimethoprim (BACTRIM DS) 800-160 MG per tablet   Oral   Take 1 tablet by mouth every 12 (twelve) hours.   20 tablet   0     BP 102/62  Pulse 73  Temp(Src) 97.6 F (36.4 C) (Oral)  Resp 16  Ht 5\' 11"  (1.803 m)  Wt 220 lb (99.791 kg)  BMI 30.7 kg/m2  SpO2 99%  Physical Exam 1415: Physical examination:  Nursing notes reviewed; Vital signs and O2 SAT reviewed;  Constitutional: Well developed, Well nourished, Well hydrated, Uncomfortable appearing.; Head:  Normocephalic, atraumatic; Eyes: EOMI, PERRL, No scleral icterus; ENMT: Mouth and pharynx normal,  Mucous membranes moist; Neck: Supple, Full range of motion, No lymphadenopathy; Cardiovascular: Regular rate and rhythm, No gallop; Respiratory: Breath sounds clear & equal bilaterally, No rales, rhonchi, wheezes.  Speaking full sentences with ease, Normal respiratory effort/excursion; Chest: Nontender, Movement normal; Abdomen: Soft, +right abd wall tender around drain insertion site with surrounding erythema and purulent drainage from site. No soft tissue crepitus. +serosanguinous drainage in tubing/bag.  Nondistended, Normal bowel sounds; Genitourinary: No CVA tenderness; Extremities: Pulses normal, No tenderness, No edema, No calf edema or asymmetry.;  Neuro: AA&Ox3, Major CN grossly intact.  Speech clear. No gross focal motor or sensory deficits in extremities.; Skin: Color normal, Warm, Dry.   ED Course  Procedures   1500:  ED RN attempted to flush drain: initially removed several clots from tubing with flow re-established, then catheter stopped flowing again, large blood clot removed from catheter, flushed again easily.   1725:  Pt continues to request "some pain meds."  Now endorses he ran out of his pain meds that were prescribed for him when he was discharged and is requesting a refill. Endorses he has been taking the abx as prescribed. States he is ready to go home now and is requesting something to eat.  WBC count improved from previous, H/H stable, afebrile.  LFT's elevated from previous, but CT scan without acute abnormality and pt is NT in his RUQ.  T/C to General Surgeon Dr. Lovell Sheehan, case discussed, including:  HPI, pertinent PM/SHx, VS/PE, dx testing, ED course and treatment:  requests to have pt continue to take his abx and call the office on Monday to schedule a follow up appointment for Tuesday.  Dx and testing, as well as d/w General Surgeon, d/w pt and family.  Questions answered.  Verb understanding, agreeable to d/c home with outpt f/u.     MDM  MDM Reviewed: previous chart, nursing note and vitals Reviewed previous: labs and CT scan Interpretation: labs and CT scan   Results for orders placed during the hospital encounter of 01/11/13  URINALYSIS, ROUTINE W REFLEX MICROSCOPIC      Result Value Range   Color, Urine YELLOW  YELLOW   APPearance CLEAR  CLEAR   Specific Gravity, Urine 1.015  1.005 - 1.030   pH 6.0  5.0 - 8.0   Glucose, UA NEGATIVE  NEGATIVE mg/dL   Hgb urine dipstick NEGATIVE  NEGATIVE   Bilirubin Urine NEGATIVE  NEGATIVE   Ketones, ur NEGATIVE  NEGATIVE mg/dL   Protein, ur NEGATIVE  NEGATIVE mg/dL   Urobilinogen, UA 0.2  0.0 - 1.0 mg/dL   Nitrite NEGATIVE  NEGATIVE   Leukocytes, UA NEGATIVE   NEGATIVE  CBC WITH DIFFERENTIAL      Result Value Range   WBC 10.2  4.0 - 10.5 K/uL   RBC 4.08 (*) 4.22 - 5.81 MIL/uL   Hemoglobin 11.1 (*) 13.0 - 17.0 g/dL   HCT 16.1 (*) 09.6 - 04.5 %   MCV 85.3  78.0 - 100.0 fL   MCH 27.2  26.0 - 34.0 pg   MCHC 31.9  30.0 - 36.0 g/dL   RDW 40.9 (*) 81.1 - 91.4 %   Platelets 581 (*) 150 - 400 K/uL   Neutrophils Relative 59  43 - 77 %   Neutro Abs 6.1  1.7 - 7.7 K/uL   Lymphocytes Relative 27  12 - 46 %   Lymphs Abs 2.8  0.7 - 4.0 K/uL   Monocytes Relative 9  3 - 12 %   Monocytes  Absolute 1.0  0.1 - 1.0 K/uL   Eosinophils Relative 3  0 - 5 %   Eosinophils Absolute 0.4  0.0 - 0.7 K/uL   Basophils Relative 1  0 - 1 %   Basophils Absolute 0.1  0.0 - 0.1 K/uL  COMPREHENSIVE METABOLIC PANEL      Result Value Range   Sodium 135  135 - 145 mEq/L   Potassium 3.9  3.5 - 5.1 mEq/L   Chloride 101  96 - 112 mEq/L   CO2 26  19 - 32 mEq/L   Glucose, Bld 93  70 - 99 mg/dL   BUN 8  6 - 23 mg/dL   Creatinine, Ser 1.61  0.50 - 1.35 mg/dL   Calcium 8.6  8.4 - 09.6 mg/dL   Total Protein 7.8  6.0 - 8.3 g/dL   Albumin 2.1 (*) 3.5 - 5.2 g/dL   AST 045 (*) 0 - 37 U/L   ALT 164 (*) 0 - 53 U/L   Alkaline Phosphatase 291 (*) 39 - 117 U/L   Total Bilirubin 0.2 (*) 0.3 - 1.2 mg/dL   GFR calc non Af Amer 68 (*) >90 mL/min   GFR calc Af Amer 78 (*) >90 mL/min  LIPASE, BLOOD      Result Value Range   Lipase 34  11 - 59 U/L  LACTIC ACID, PLASMA      Result Value Range   Lactic Acid, Venous 1.7  0.5 - 2.2 mmol/L    Ct Abdomen Pelvis W Contrast 01/11/2013  *RADIOLOGY REPORT*  Clinical Data: Right-sided abdominal pain.  Retroperitoneal abscess.  CT ABDOMEN AND PELVIS WITH CONTRAST  Technique:  Multidetector CT imaging of the abdomen and pelvis was performed following the standard protocol during bolus administration of intravenous contrast.  Contrast: 50mL OMNIPAQUE IOHEXOL 300 MG/ML  SOLN, OMNIPAQUE IOHEXOL 300 MG/ML  SOLN  Comparison: CT of the abdomen and  pelvis 01/03/2013.  Findings:  Lung Bases: Dependent atelectasis is noted in the lung bases bilaterally (right greater than left).  Mild cardiomegaly.  Small hiatal hernia.  Abdomen/Pelvis:  Compared to the prior examination, the previously noted large multilocular rim enhancing fluid collection in the right psoas musculature is significantly smaller in size.  There is a right-sided pigtail drainage catheter placed within the inferior aspect of this collection on today's images, there is only a small amount of residual fluid and gas within the collection, compatible with positive response to the drainage procedure.  No new surrounding rim enhancing fluid collections are otherwise noted to suggest worsening abscesses in the region.  There continues to be some mass effect on the adjacent right kidney, and mild fullness of the right renal collecting system, suggesting some mild relative obstruction secondary to some periureteric edema and inflammation. There is a small calyceal diverticulum in the posterior aspect of the interpolar region of the right kidney.  The appearance of the liver, gallbladder, pancreas, spleen and bilateral adrenal glands is unremarkable.  In the left renal pelvis there is a large nonobstructive calculus measuring 17 mm which is similar to the prior examination.  No significant volume of ascites.  No pneumoperitoneum.  No pathologic distension of small bowel.  No definite its pathologic lymphadenopathy identified within the abdomen or pelvis.  Numerous retroperitoneal lymph nodes are borderline enlarged and presumably reactive.  Prostate gland and urinary bladder are unremarkable in appearance.  Musculoskeletal: There are no aggressive appearing lytic or blastic lesions noted in the visualized portions of the skeleton.  IMPRESSION: 1.  Right-sided retroperitoneal drainage catheter is in place with significant decrease in size of previously noted right-sided psoas abscess.  There continues to be  some surrounding inflammatory changes which presumably a cause mild relative obstruction of the right ureter (likely secondary to edema and inflammation around the ureter), but the overall appearance is much improved compared to the recent study. 2.  17 mm nonobstructive calculus in the left renal pelvis is not again noted. 3.  Additional incidental findings, similar to prior studies, as above.   Original Report Authenticated By: Trudie Reed, M.D.     Results for TAKUMA, CIFELLI (MRN [id number]) as of 01/11/2013 17:58  Ref. Range 01/04/2013 05:07 01/05/2013 06:21 01/06/2013 05:27 01/07/2013 05:20 01/11/2013 14:22  Hemoglobin Latest Range: 13.0-17.0 g/dL 9.7 (L) 9.7 (L) 40.9 (L) 10.3 (L) 11.1 (L)  HCT Latest Range: 39.0-52.0 % 29.2 (L) 29.1 (L) 30.3 (L) 31.4 (L) 34.8 (L)             Laray Anger, DO 01/14/13 1207

## 2013-01-11 NOTE — ED Notes (Signed)
Catheter stopped flowing.  Disconnected tubing and pulled large blood clot from tubing and catheter.  Irrigated w/60cc NS.  Able to flush easily, but only able to aspirate to a point, then meets resistance.  Dr. Richrd Prime notified.

## 2013-01-13 LAB — URINE CULTURE
Colony Count: NO GROWTH
Culture: NO GROWTH

## 2013-01-14 NOTE — Discharge Summary (Signed)
Physician Discharge Summary  Patient ID: SOMA LIZAK MRN: [id number] DOB/AGE: 47-Jun-1967 47 y.o.  Admit date: 01/03/2013 Discharge date: 01/07/2013  Admission Diagnoses:  Retroperitoneal abscess  Discharge Diagnoses: The same Active Problems:   * No active hospital problems. *   Discharged Condition: stable  Hospital Course: Patient presented to [facility] with Right back pain.  ED work-up demonstrated a large psoas abscess.  Patient was referred to IR for drainage.  Patient returned from [facility] and was continued on IV abx.  Patient Continued to demonstrate clinical improvement.  He was discharged on oral abx.  Consults: Interventional radiology  Significant Diagnostic Studies: labs: daily  Treatments: IV hydration and antibiotics: Invanz, vancomycin  Discharge Exam: Blood pressure 100/63, pulse 66, temperature 97.3 F (36.3 C), temperature source Oral, resp. rate 20, height 5\' 11"  (1.803 m), weight 99.791 kg (220 lb), SpO2 98.00%. General appearance: alert and no distress Resp: clear to auscultation bilaterally Cardio: regular rate and rhythm GI: soft, non-tender; bowel sounds normal; no masses,  no organomegaly  Disposition: 01-Home or Self Care     Medication List    TAKE these medications       oxyCODONE-acetaminophen 5-325 MG per tablet  Commonly known as:  PERCOCET/ROXICET  Take 1-2 tablets by mouth every 4 (four) hours as needed.     sulfamethoxazole-trimethoprim 800-160 MG per tablet  Commonly known as:  BACTRIM DS  Take 1 tablet by mouth every 12 (twelve) hours.           Follow-up Information   Follow up with Fabio Bering, MD On 01/15/2013. (At 9:45am)    Contact information:   [address] [place] [address] [phone]       Signed: Fabio Bering 01/14/2013, 11:04 PM

## 2013-01-17 ENCOUNTER — Other Ambulatory Visit (HOSPITAL_COMMUNITY): Payer: Self-pay | Admitting: General Surgery

## 2013-01-17 DIAGNOSIS — L0291 Cutaneous abscess, unspecified: Secondary | ICD-10-CM

## 2013-01-21 ENCOUNTER — Ambulatory Visit (HOSPITAL_COMMUNITY): Payer: Self-pay

## 2013-02-11 ENCOUNTER — Encounter (HOSPITAL_COMMUNITY): Payer: Self-pay

## 2013-02-11 ENCOUNTER — Ambulatory Visit (HOSPITAL_COMMUNITY)
Admission: RE | Admit: 2013-02-11 | Discharge: 2013-02-11 | Disposition: A | Discharge status: Home / Self Care | Payer: Self-pay | Source: Ambulatory Visit | Attending: General Surgery | Admitting: General Surgery

## 2013-02-11 DIAGNOSIS — K6812 Psoas muscle abscess: Secondary | ICD-10-CM | POA: Insufficient documentation

## 2013-02-11 DIAGNOSIS — N200 Calculus of kidney: Secondary | ICD-10-CM | POA: Insufficient documentation

## 2013-02-11 DIAGNOSIS — M109 Gout, unspecified: Secondary | ICD-10-CM | POA: Insufficient documentation

## 2013-02-11 DIAGNOSIS — K449 Diaphragmatic hernia without obstruction or gangrene: Secondary | ICD-10-CM | POA: Insufficient documentation

## 2013-02-11 DIAGNOSIS — L0291 Cutaneous abscess, unspecified: Secondary | ICD-10-CM

## 2013-02-11 DIAGNOSIS — R599 Enlarged lymph nodes, unspecified: Secondary | ICD-10-CM | POA: Insufficient documentation

## 2013-02-11 MED ORDER — IOHEXOL 300 MG/ML  SOLN
100.0000 mL | Freq: Once | INTRAMUSCULAR | Status: AC | PRN
  Administered 2013-02-11: 100 mL via INTRAVENOUS

## 2013-02-23 ENCOUNTER — Emergency Department (HOSPITAL_COMMUNITY): Payer: Self-pay

## 2013-02-23 ENCOUNTER — Encounter (HOSPITAL_COMMUNITY): Payer: Self-pay | Admitting: *Deleted

## 2013-02-23 ENCOUNTER — Emergency Department (HOSPITAL_COMMUNITY)
Admission: EM | Admit: 2013-02-23 | Discharge: 2013-02-23 | Discharge status: Against Medical Advice | Payer: Self-pay | Attending: Emergency Medicine | Admitting: Emergency Medicine

## 2013-02-23 DIAGNOSIS — R0789 Other chest pain: Secondary | ICD-10-CM

## 2013-02-23 DIAGNOSIS — R071 Chest pain on breathing: Secondary | ICD-10-CM | POA: Insufficient documentation

## 2013-02-23 DIAGNOSIS — Z792 Long term (current) use of antibiotics: Secondary | ICD-10-CM | POA: Insufficient documentation

## 2013-02-23 DIAGNOSIS — Z862 Personal history of diseases of the blood and blood-forming organs and certain disorders involving the immune mechanism: Secondary | ICD-10-CM | POA: Insufficient documentation

## 2013-02-23 DIAGNOSIS — Z872 Personal history of diseases of the skin and subcutaneous tissue: Secondary | ICD-10-CM | POA: Insufficient documentation

## 2013-02-23 DIAGNOSIS — F17200 Nicotine dependence, unspecified, uncomplicated: Secondary | ICD-10-CM | POA: Insufficient documentation

## 2013-02-23 DIAGNOSIS — Z8639 Personal history of other endocrine, nutritional and metabolic disease: Secondary | ICD-10-CM | POA: Insufficient documentation

## 2013-02-23 DIAGNOSIS — M25519 Pain in unspecified shoulder: Secondary | ICD-10-CM | POA: Insufficient documentation

## 2013-02-23 DIAGNOSIS — M79609 Pain in unspecified limb: Secondary | ICD-10-CM | POA: Insufficient documentation

## 2013-02-23 MED ORDER — KETOROLAC TROMETHAMINE 60 MG/2ML IM SOLN: 60.0000 mg | Freq: Once | INTRAMUSCULAR | Status: DC

## 2013-02-23 NOTE — ED Notes (Signed)
Patient left without signing out.

## 2013-02-23 NOTE — ED Notes (Signed)
Pt reporting having a hematoma drained in his back about a month ago.  About 1 week ago, drains were removed and pt was released to go back to work on light duty.  Pt presently reporting severe pain in right shoulder, arm and in lower back.

## 2013-02-23 NOTE — ED Provider Notes (Signed)
History     CSN: [id number]  Arrival date & time 02/23/13  0030   First MD Initiated Contact with Patient 02/23/13 [phone]      Chief Complaint  Patient presents with  . Arm Pain  . Shoulder Pain    (Consider location/radiation/quality/duration/timing/severity/associated sxs/prior treatment) HPI [name] is a 47 y.o. male who presents to the Emergency Department complaining of onset of right sided chest pain that extends up under his armpit and into his right shoulder. He had an infection (retroperitoneal) that required drainage tubes in March. He is having pain with respiration and with movement of his right arm.Denies fever, chills.   Past Medical History  Diagnosis Date  . Gout   . Retroperitoneal abscess 12/2012    Dr. Leticia Penna    Past Surgical History  Procedure Laterality Date  . Hernia repair      History reviewed. No pertinent family history.  History  Substance Use Topics  . Smoking status: Current Every Day Smoker    Types: Cigarettes  . Smokeless tobacco: Not on file  . Alcohol Use: Yes     Comment: rarely      Review of Systems  Constitutional: Negative for fever.       10 Systems reviewed and are negative for acute change except as noted in the HPI.  HENT: Negative for congestion.   Eyes: Negative for discharge and redness.  Respiratory: Negative for cough and shortness of breath.   Cardiovascular: Positive for chest pain.  Gastrointestinal: Negative for vomiting and abdominal pain.  Musculoskeletal: Negative for back pain.       Right shoulder pain  Skin: Negative for rash.  Neurological: Negative for syncope, numbness and headaches.  Psychiatric/Behavioral:       No behavior change.    Allergies  Bee venom  Home Medications   Current Outpatient Rx  Name  Route  Sig  Dispense  Refill  . acetaminophen (TYLENOL) 500 MG tablet   Oral   Take 1,000 mg by mouth every 6 (six) hours as needed for pain.         .  Aspirin-Acetaminophen-Caffeine (GOODYS EXTRA STRENGTH PO)   Oral   Take 1 Package by mouth daily as needed (fever).         Marland Kitchen oxyCODONE-acetaminophen (PERCOCET/ROXICET) 5-325 MG per tablet   Oral   Take 1-2 tablets by mouth every 4 (four) hours as needed.   45 tablet   0   . oxyCODONE-acetaminophen (PERCOCET/ROXICET) 5-325 MG per tablet      1 or 2 tabs PO q6h prn pain   20 tablet   0   . sulfamethoxazole-trimethoprim (BACTRIM DS) 800-160 MG per tablet   Oral   Take 1 tablet by mouth every 12 (twelve) hours.   20 tablet   0     BP 125/98  Pulse 96  Temp(Src) 98.3 F (36.8 C) (Oral)  Resp 24  Ht 5\' 11"  (1.803 m)  Wt 200 lb (90.719 kg)  BMI 27.91 kg/m2  SpO2 98%  Physical Exam  Nursing note and vitals reviewed. Constitutional: He appears well-developed and well-nourished.  Awake, alert, nontoxic appearance.  HENT:  Head: Normocephalic and atraumatic.  Right Ear: External ear normal.  Left Ear: External ear normal.  Eyes: EOM are normal. Pupils are equal, round, and reactive to light.  Neck: Normal range of motion. Neck supple.  Cardiovascular: Normal rate and intact distal pulses.   Pulmonary/Chest: Effort normal. He exhibits no tenderness.  Pain with  palpation over right chest wall under arm and to shoulder. No crepitus.  Abdominal: Soft. There is no tenderness. There is no rebound.  Lower right side healed.  Musculoskeletal: He exhibits no tenderness.  Baseline ROM, no obvious new focal weakness.  Neurological:  Mental status and motor strength appears baseline for patient and situation.  Skin: No rash noted.  Psychiatric: He has a normal mood and affect.    ED Course  Procedures (including critical care time)     1. Chest wall pain    Medications  ketorolac (TORADOL) injection 60 mg (not administered)     0253 Patient is leaving the ER AMA. He refused the toradol and xray. MDM  Patient with chest wall pain to the right side who left AMA.    MDM Reviewed: nursing note and vitals           Nicoletta Dress. Colon Branch, MD 02/23/13 1610

## 2013-12-12 IMAGING — CT CT ABD-PELV W/ CM
2 of 4 series · 15 of 46 positions shown, 17 images · IV contrast (Omnipaque 300)
Comparison: CT of the abdomen and pelvis 01/03/2013.

CLINICAL DATA: Right-sided abdominal pain.  Retroperitoneal
abscess.

CT ABDOMEN AND PELVIS WITH CONTRAST
TECHNIQUE: Multidetector CT imaging of the abdomen and pelvis was
performed following the standard protocol during bolus
administration of intravenous contrast.
Contrast: 50mL OMNIPAQUE IOHEXOL 300 MG/ML  SOLN, 100mL OMNIPAQUE
IOHEXOL 300 MG/ML  SOLN

[Series 2: abd_pel_with 5.0 b40f · axial · 0.78mm/px · z∈[+722,+1142]mm · 12 of 97 slices shown, 14 images]
[im 7/97  soft-tissue]
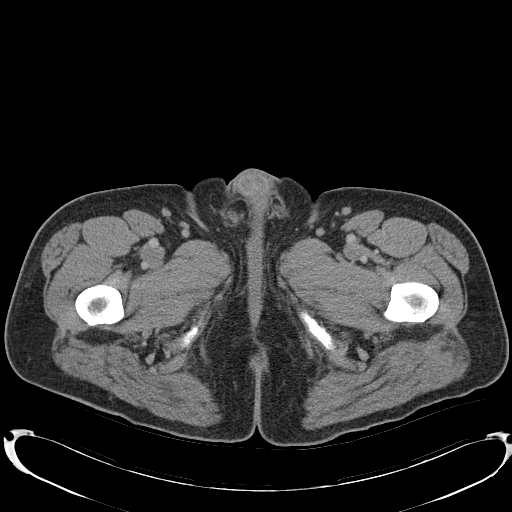
[im 7/97  bone]
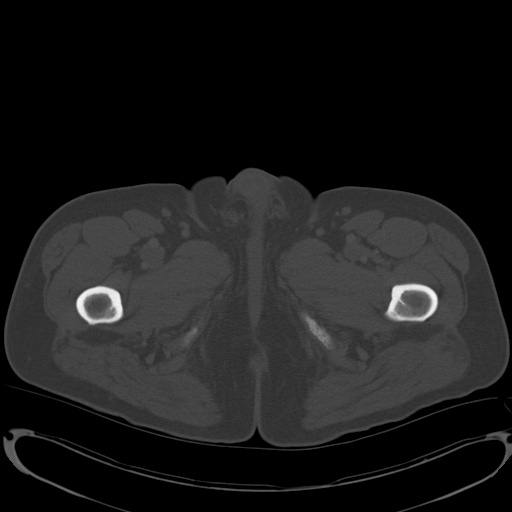
[im 13/97  soft-tissue]
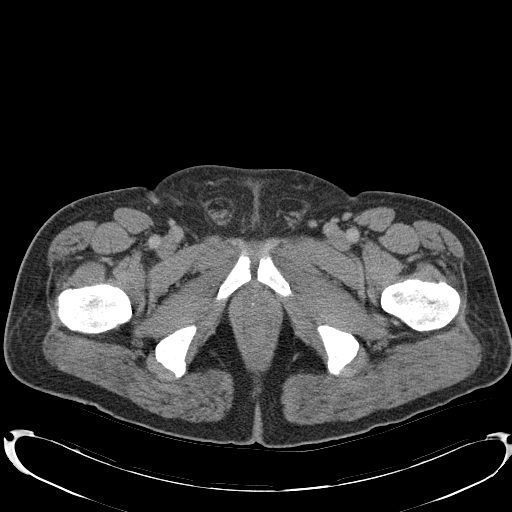
[im 25/97  soft-tissue]
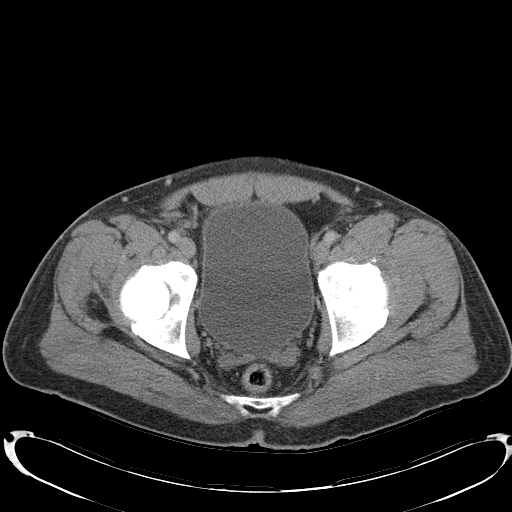
[im 31/97  soft-tissue]
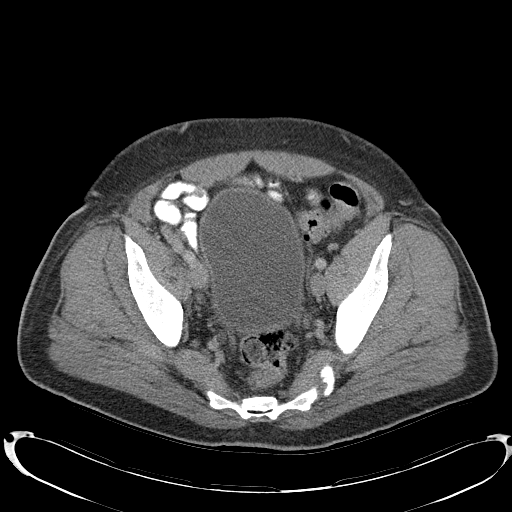
[im 37/97  soft-tissue]
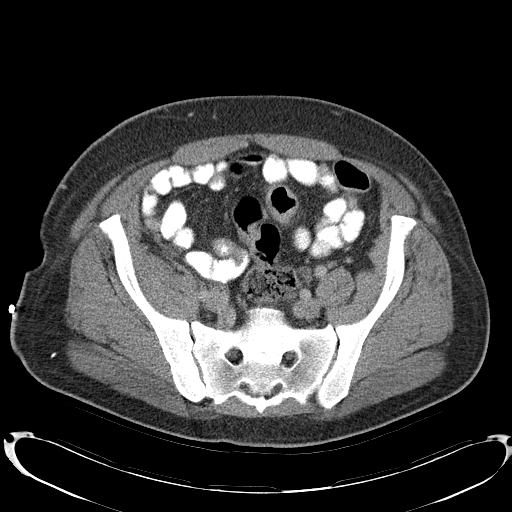
[im 43/97  soft-tissue]
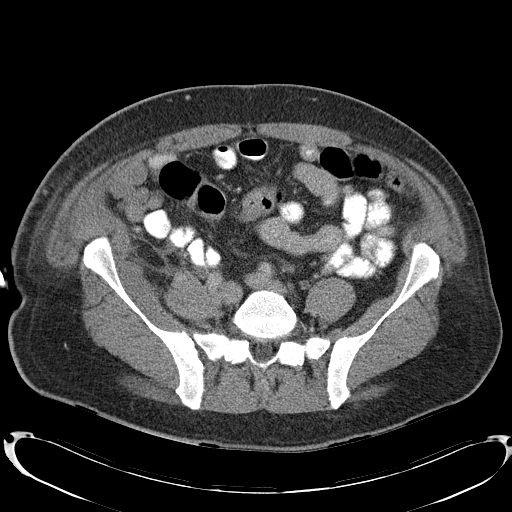
[im 55/97  soft-tissue]
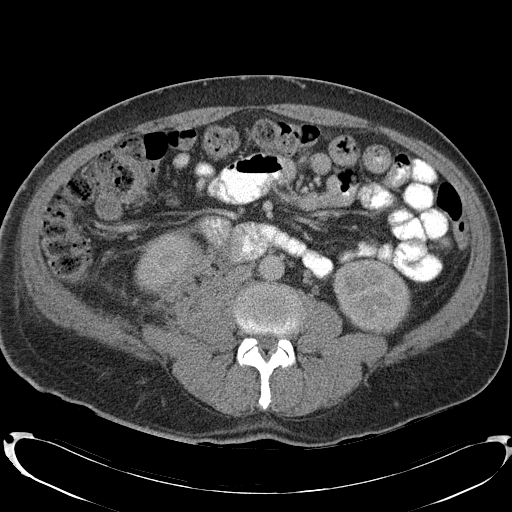
[im 61/97  soft-tissue]
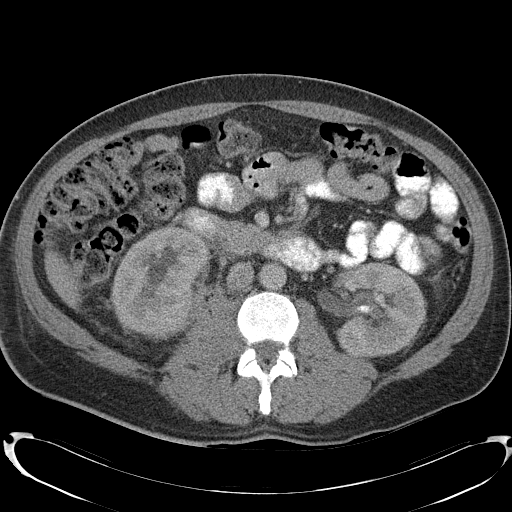
[im 67/97  soft-tissue]
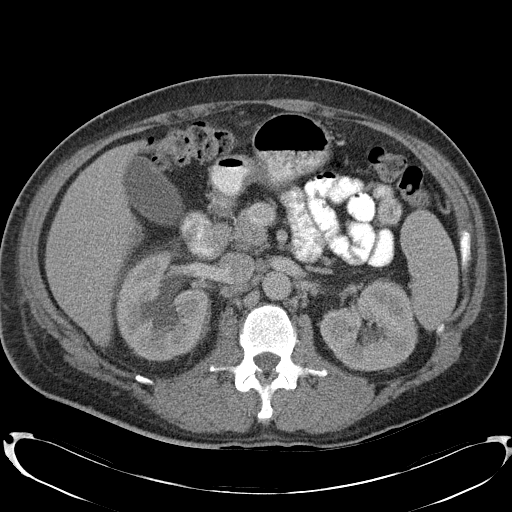
[im 67/97  bone]
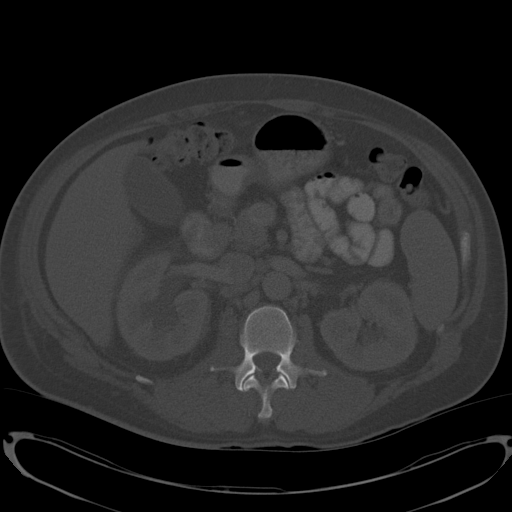
[im 73/97  soft-tissue]
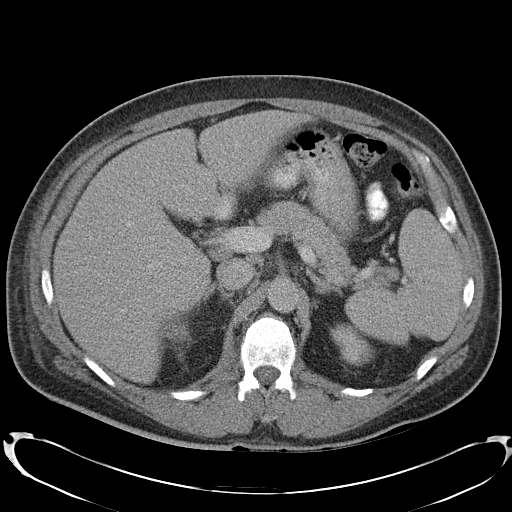
[im 85/97  soft-tissue]
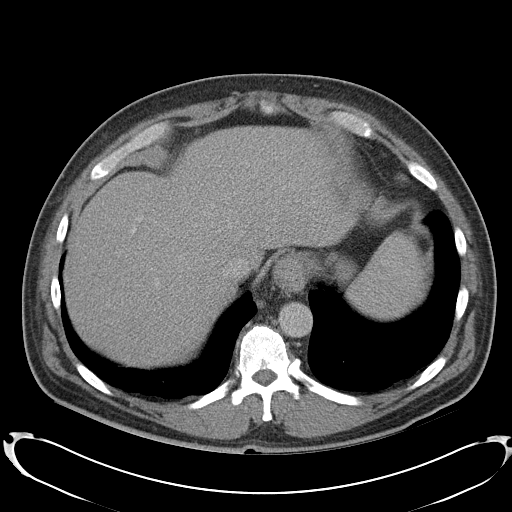
[im 91/97  soft-tissue]
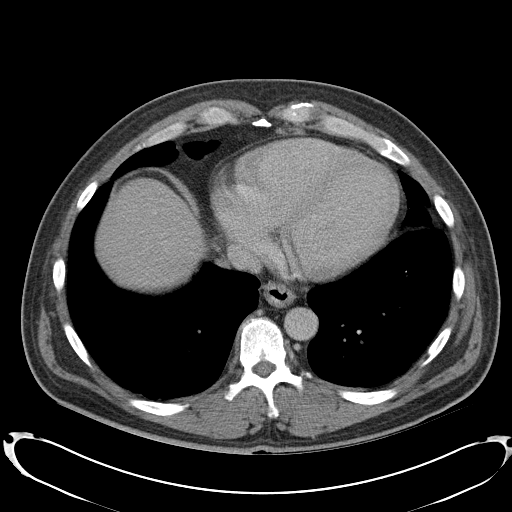

[Series 4: abd_pel_with 3.0 spo cor · coronal · 0.70mm/px · 3 of 90 slices shown]
[im 30/90  soft-tissue]
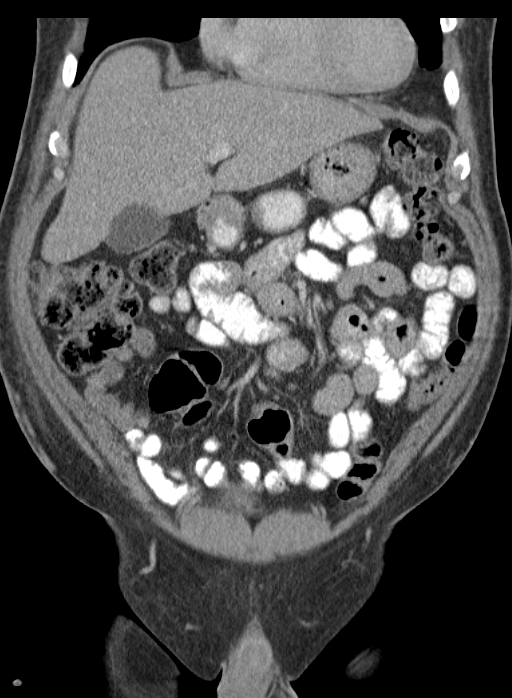
[im 40/90  soft-tissue]
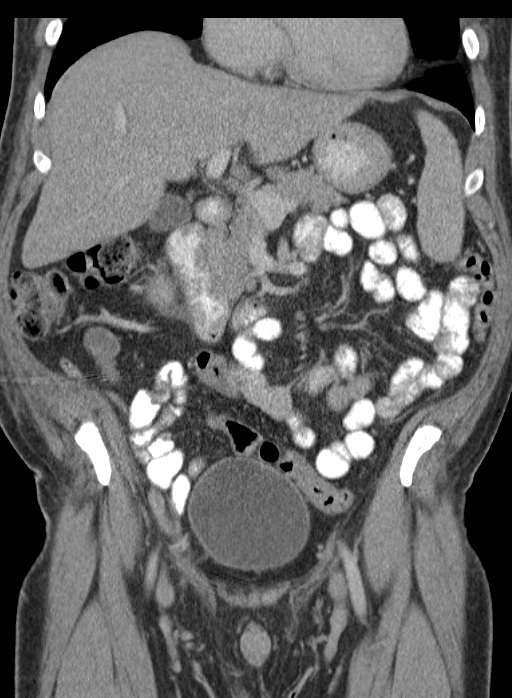
[im 50/90  soft-tissue]
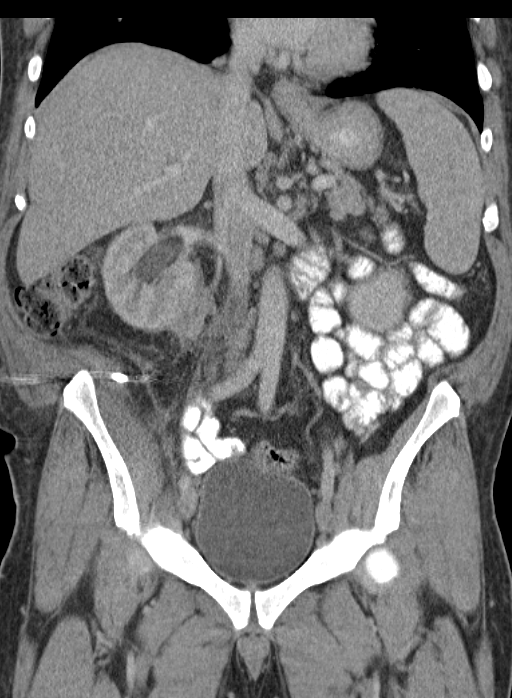

[15 of 46 positions shown; findings below may reference images not displayed]

FINDINGS: Lung Bases: Dependent atelectasis is noted in the lung bases
bilaterally (right greater than left).  Mild cardiomegaly.  Small
hiatal hernia.

Abdomen/Pelvis:  Compared to the prior examination, the previously
noted large multilocular rim enhancing fluid collection in the
right psoas musculature is significantly smaller in size.  There is
a right-sided pigtail drainage catheter placed within the inferior
aspect of this collection on today's images, there is only a small
amount of residual fluid and gas within the collection, compatible
with positive response to the drainage procedure.  No new
surrounding rim enhancing fluid collections are otherwise noted to
suggest worsening abscesses in the region.  There continues to be
some mass effect on the adjacent right kidney, and mild fullness of
the right renal collecting system, suggesting some mild relative
obstruction secondary to some periureteric edema and inflammation.
There is a small calyceal diverticulum in the posterior aspect of
the interpolar region of the right kidney.

The appearance of the liver, gallbladder, pancreas, spleen and
bilateral adrenal glands is unremarkable.  In the left renal pelvis
there is a large nonobstructive calculus measuring 17 mm which is
similar to the prior examination.

No significant volume of ascites.  No pneumoperitoneum.  No
pathologic distension of small bowel.  No definite its pathologic
lymphadenopathy identified within the abdomen or pelvis.  Numerous
retroperitoneal lymph nodes are borderline enlarged and presumably
reactive.  Prostate gland and urinary bladder are unremarkable in
appearance.

Musculoskeletal: There are no aggressive appearing lytic or blastic
lesions noted in the visualized portions of the skeleton.
IMPRESSION: 1.  Right-sided retroperitoneal drainage catheter is in place with
significant decrease in size of previously noted right-sided psoas
abscess.  There continues to be some surrounding inflammatory
changes which presumably a cause mild relative obstruction of the
right ureter (likely secondary to edema and inflammation around the
ureter), but the overall appearance is much improved compared to
the recent study.
2.  17 mm nonobstructive calculus in the left renal pelvis is not
again noted.
3.  Additional incidental findings, similar to prior studies, as
above.

## 2014-10-24 HISTORY — PX: OTHER SURGICAL HISTORY: SHX169

## 2014-12-25 ENCOUNTER — Emergency Department (HOSPITAL_COMMUNITY)
Admission: EM | Admit: 2014-12-25 | Discharge: 2014-12-25 | Disposition: A | Discharge status: Home / Self Care | Payer: Self-pay | Attending: Emergency Medicine | Admitting: Emergency Medicine

## 2014-12-25 ENCOUNTER — Encounter (HOSPITAL_COMMUNITY): Payer: Self-pay | Admitting: *Deleted

## 2014-12-25 ENCOUNTER — Emergency Department (HOSPITAL_COMMUNITY): Payer: Self-pay

## 2014-12-25 DIAGNOSIS — R109 Unspecified abdominal pain: Secondary | ICD-10-CM

## 2014-12-25 DIAGNOSIS — Z8719 Personal history of other diseases of the digestive system: Secondary | ICD-10-CM | POA: Insufficient documentation

## 2014-12-25 DIAGNOSIS — Z79899 Other long term (current) drug therapy: Secondary | ICD-10-CM | POA: Insufficient documentation

## 2014-12-25 DIAGNOSIS — Z720 Tobacco use: Secondary | ICD-10-CM | POA: Insufficient documentation

## 2014-12-25 DIAGNOSIS — M109 Gout, unspecified: Secondary | ICD-10-CM | POA: Insufficient documentation

## 2014-12-25 DIAGNOSIS — Z7982 Long term (current) use of aspirin: Secondary | ICD-10-CM | POA: Insufficient documentation

## 2014-12-25 DIAGNOSIS — R319 Hematuria, unspecified: Secondary | ICD-10-CM

## 2014-12-25 DIAGNOSIS — N200 Calculus of kidney: Secondary | ICD-10-CM | POA: Insufficient documentation

## 2014-12-25 HISTORY — DX: Calculus of kidney: N20.0

## 2014-12-25 LAB — CBC
HEMATOCRIT: 42.2 % (ref 39.0–52.0)
HEMOGLOBIN: 14.3 g/dL (ref 13.0–17.0)
MCH: 31.7 pg (ref 26.0–34.0)
MCHC: 33.9 g/dL (ref 30.0–36.0)
MCV: 93.6 fL (ref 78.0–100.0)
Platelets: 378 10*3/uL (ref 150–400)
RBC: 4.51 MIL/uL (ref 4.22–5.81)
RDW: 13.7 % (ref 11.5–15.5)
WBC: 9.9 10*3/uL (ref 4.0–10.5)

## 2014-12-25 LAB — BASIC METABOLIC PANEL
Anion gap: 7 (ref 5–15)
BUN: 19 mg/dL (ref 6–23)
CO2: 26 mmol/L (ref 19–32)
Calcium: 8.7 mg/dL (ref 8.4–10.5)
Chloride: 101 mmol/L (ref 96–112)
Creatinine, Ser: 0.88 mg/dL (ref 0.50–1.35)
Glucose, Bld: 131 mg/dL — ABNORMAL HIGH (ref 70–99)
POTASSIUM: 4.1 mmol/L (ref 3.5–5.1)
SODIUM: 134 mmol/L — AB (ref 135–145)

## 2014-12-25 LAB — URINALYSIS, ROUTINE W REFLEX MICROSCOPIC
BILIRUBIN URINE: NEGATIVE
GLUCOSE, UA: NEGATIVE mg/dL
Ketones, ur: NEGATIVE mg/dL
LEUKOCYTES UA: NEGATIVE
Nitrite: NEGATIVE
PH: 6 (ref 5.0–8.0)
Protein, ur: NEGATIVE mg/dL
Specific Gravity, Urine: 1.02 (ref 1.005–1.030)
Urobilinogen, UA: 0.2 mg/dL (ref 0.0–1.0)

## 2014-12-25 LAB — URINE MICROSCOPIC-ADD ON

## 2014-12-25 MED ORDER — HYDROMORPHONE HCL 1 MG/ML IJ SOLN
1.0000 mg | Freq: Once | INTRAMUSCULAR | Status: AC
  Administered 2014-12-25: 1 mg via INTRAVENOUS
  Filled 2014-12-25: qty 1

## 2014-12-25 MED ORDER — OXYCODONE-ACETAMINOPHEN 5-325 MG PO TABS: 1.0000 | ORAL_TABLET | ORAL | Status: DC | PRN

## 2014-12-25 MED ORDER — PROMETHAZINE HCL 25 MG PO TABS: 25.0000 mg | ORAL_TABLET | Freq: Four times a day (QID) | ORAL | Status: DC | PRN

## 2014-12-25 MED ORDER — OXYCODONE-ACETAMINOPHEN 5-325 MG PO TABS
2.0000 | ORAL_TABLET | Freq: Once | ORAL | Status: AC
  Administered 2014-12-25: 2 via ORAL
  Filled 2014-12-25: qty 2

## 2014-12-25 MED ORDER — ONDANSETRON HCL 4 MG/2ML IJ SOLN
4.0000 mg | Freq: Once | INTRAMUSCULAR | Status: AC
  Administered 2014-12-25: 4 mg via INTRAVENOUS
  Filled 2014-12-25: qty 2

## 2014-12-25 MED ORDER — NAPROXEN 500 MG PO TABS: 500.0000 mg | ORAL_TABLET | Freq: Two times a day (BID) | ORAL | Status: DC

## 2014-12-25 MED ORDER — KETOROLAC TROMETHAMINE 30 MG/ML IJ SOLN
30.0000 mg | Freq: Once | INTRAMUSCULAR | Status: AC
  Administered 2014-12-25: 30 mg via INTRAVENOUS
  Filled 2014-12-25: qty 1

## 2014-12-25 NOTE — ED Notes (Signed)
Rt flank pain, n/v,  Ureteral stent lt side. By [place] in [place].  For 3 weeks.

## 2014-12-25 NOTE — ED Provider Notes (Signed)
CSN: [id number]     Arrival date & time 12/25/14  1504 History   This chart was scribed for Vida Roller, MD by Abel Presto, ED Scribe. This patient was seen in room APA05/APA05 and the patient's care was started at 3:31 PM.    Chief Complaint  Patient presents with  . Flank Pain    Patient is a 49 y.o. male presenting with flank pain. The history is provided by the patient. No language interpreter was used.  Flank Pain   HPI Comments: [name] is a 49 y.o. male who presents to the Emergency Department complaining of constant right flank pain with onset yesterday. Pt had a stent placed 3 weeks ago. Pt states he had a large renal calculus in left kidney at that time. He notes he has passed several small stones recently. Pt has been unable to tolerate food in last 3 days.  Pt notes associated dysuria and cloudy urine. Pt denies fever, nausea, and vomiting.   Past Medical History  Diagnosis Date  . Gout   . Retroperitoneal abscess 12/2012    Dr. Leticia Penna  . Kidney stones    Past Surgical History  Procedure Laterality Date  . Hernia repair    . Urteral stent     History reviewed. No pertinent family history. History  Substance Use Topics  . Smoking status: Current Every Day Smoker    Types: Cigarettes  . Smokeless tobacco: Not on file  . Alcohol Use: No     Comment: rarely    Review of Systems  Genitourinary: Positive for flank pain.  All other systems reviewed and are negative.     Allergies  Bee venom  Home Medications   Prior to Admission medications   Medication Sig Start Date End Date Taking? Authorizing Provider  acetaminophen (TYLENOL) 500 MG tablet Take 1,000 mg by mouth every 6 (six) hours as needed for pain.   Yes Historical Provider, MD  Aspirin-Acetaminophen-Caffeine (GOODYS EXTRA STRENGTH PO) Take 1 Package by mouth daily as needed (pain).    Yes Historical Provider, MD  ibuprofen (ADVIL,MOTRIN) 200 MG tablet Take 600 mg by mouth every 6 (six)  hours as needed (pain).   Yes Historical Provider, MD  naproxen (NAPROSYN) 500 MG tablet Take 1 tablet (500 mg total) by mouth 2 (two) times daily with a meal. 12/25/14   Vida Roller, MD  oxyCODONE-acetaminophen (PERCOCET) 5-325 MG per tablet Take 1 tablet by mouth every 4 (four) hours as needed. 12/25/14   Vida Roller, MD  promethazine (PHENERGAN) 25 MG tablet Take 1 tablet (25 mg total) by mouth every 6 (six) hours as needed for nausea or vomiting. 12/25/14   Vida Roller, MD  sulfamethoxazole-trimethoprim (BACTRIM DS) 800-160 MG per tablet Take 1 tablet by mouth every 12 (twelve) hours. Patient not taking: Reported on 12/25/2014 01/07/13   Fabio Bering, MD   BP 115/78 mmHg  Pulse 65  Temp(Src) 97.9 F (36.6 C) (Oral)  Resp 20  Ht  (1.803 m)  Wt 230 lb (104.327 kg)  BMI 32.09 kg/m2  SpO2 96% Physical Exam  Constitutional: He appears well-developed and well-nourished. No distress.  uncomfortable appearing  HENT:  Head: Normocephalic and atraumatic.  Mouth/Throat: Oropharynx is clear and moist. No oropharyngeal exudate.  Eyes: Conjunctivae and EOM are normal. Pupils are equal, round, and reactive to light. Right eye exhibits no discharge. Left eye exhibits no discharge. No scleral icterus.  Neck: Normal range of motion. Neck supple.  No JVD present. No thyromegaly present.  Cardiovascular: Normal rate, regular rhythm, normal heart sounds and intact distal pulses.  Exam reveals no gallop and no friction rub.   No murmur heard. Pulmonary/Chest: Effort normal and breath sounds normal. No respiratory distress. He has no wheezes. He has no rales.  Abdominal: Soft. Bowel sounds are normal. He exhibits no distension and no mass. There is no tenderness. There is CVA tenderness (left).  Musculoskeletal: Normal range of motion. He exhibits no edema or tenderness.  Lymphadenopathy:    He has no cervical adenopathy.  Neurological: He is alert. Coordination normal.  Skin: Skin is warm and  dry. No rash noted. He is not diaphoretic. No erythema.  Psychiatric: He has a normal mood and affect. His behavior is normal.  Nursing note and vitals reviewed.   ED Course  Procedures (including critical care time) DIAGNOSTIC STUDIES: Oxygen Saturation is 97% on room air, normal by my interpretation.    COORDINATION OF CARE: 3:37 PM Discussed treatment plan with patient at beside, the patient agrees with the plan and has no further questions at this time.   Labs Review Labs Reviewed  BASIC METABOLIC PANEL - Abnormal; Notable for the following:    Sodium 134 (*)    Glucose, Bld 131 (*)    All other components within normal limits  URINALYSIS, ROUTINE W REFLEX MICROSCOPIC - Abnormal; Notable for the following:    Hgb urine dipstick LARGE (*)    All other components within normal limits  URINE CULTURE  CBC  URINE MICROSCOPIC-ADD ON    Imaging Review Ct Renal Stone Study  12/25/2014   CLINICAL DATA:  Right flank pain since yesterday dysuria with cloudy urine. History of left ureteral stent placement 3 weeks ago. Scheduled for lithotripsy 01/08/2015. Assessment encounter.  EXAM: CT ABDOMEN AND PELVIS WITHOUT CONTRAST  TECHNIQUE: Multidetector CT imaging of the abdomen and pelvis was performed following the standard protocol without IV contrast.  COMPARISON:  CTs 02/11/2013 and 01/11/2013.  FINDINGS: Lower chest: Clear lung bases. No significant pleural or pericardial effusion.  Hepatobiliary: As evaluated in the noncontrast state, the liver appears unremarkable. No evidence of gallstones, gallbladder wall thickening or biliary dilatation.  Pancreas: Unremarkable. No pancreatic ductal dilatation or surrounding inflammatory changes.  Spleen: Normal in size without focal abnormality.  Adrenals/Urinary Tract: Both adrenal glands appear normal.Double-J left ureteral stent appears well positioned. There is a large calculus at the left ureteral pelvic junction, measuring 1.3 cm transverse on image  41. This measures up to 2.0 cm on coronal image 65. No stones are seen along the course of the stent. There is no evidence of right-sided urinary tract or bladder calculus. There is no hydronephrosis. Mild bladder wall thickening is present, likely in part secondary to incomplete bladder distention.  Stomach/Bowel: No evidence of bowel wall thickening, distention or surrounding inflammatory change.Mild sigmoid diverticulosis. The appendix appears normal.  Vascular/Lymphatic: Small lymph nodes within the porta hepatis, retroperitoneum and pelvis are not enlarged. Minimal aortic atherosclerosis noted.  Reproductive: Small dystrophic calcifications are present within the prostate gland, asymmetric to the right.  Other: Stable small umbilical hernia containing only fat. Right pelvic drain has been removed. There is no significant residual inflammatory change surrounding the right psoas muscle.  Musculoskeletal: No acute or significant osseous findings.  IMPRESSION: 1. Double-J left ureteral stent appears well positioned. Large calculus at the left ureteral pelvic junction appears nonobstructive. 2. No evidence of right-sided hydronephrosis or right-sided urinary tract calculus. No recurrent right retroperitoneal  inflammatory changes observed following right pelvic drain removal. 3. Stable small lymph nodes, likely reactive.   Electronically Signed   By: Carey BullocksWilliam  Veazey M.D.   On: 12/25/2014 16:46      MDM   Final diagnoses:  Flank pain  Left nephrolithiasis  Hematuria   Discussed results with patient - CT confirms no obstruction - large stone - hematuria without infection - can pursue outpt management.  Meds given in ED:  Medications  oxyCODONE-acetaminophen (PERCOCET/ROXICET) 5-325 MG per tablet 2 tablet (not administered)  HYDROmorphone (DILAUDID) injection 1 mg (1 mg Intravenous Given 12/25/14 1554)  ketorolac (TORADOL) 30 MG/ML injection 30 mg (30 mg Intravenous Given 12/25/14 1554)  ondansetron  (ZOFRAN) injection 4 mg (4 mg Intravenous Given 12/25/14 1554)    New Prescriptions   NAPROXEN (NAPROSYN) 500 MG TABLET    Take 1 tablet (500 mg total) by mouth 2 (two) times daily with a meal.   OXYCODONE-ACETAMINOPHEN (PERCOCET) 5-325 MG PER TABLET    Take 1 tablet by mouth every 4 (four) hours as needed.   PROMETHAZINE (PHENERGAN) 25 MG TABLET    Take 1 tablet (25 mg total) by mouth every 6 (six) hours as needed for nausea or vomiting.    Filed Vitals:   12/25/14 1511 12/25/14 1600 12/25/14 1630 12/25/14 1700  BP: 160/140 128/68 125/83 115/78  Pulse: 95 83 72 65  Temp: 97.9 F (36.6 C)     TempSrc: Oral     Resp: 20     Height: 5\' 11"  (1.803 m)     Weight: 230 lb (104.327 kg)     SpO2: 97% 95% 95% 96%   I personally performed the services described in this documentation, which was scribed in my presence. The recorded information has been reviewed and is accurate.          Vida RollerBrian D Willine Schwalbe, MD 12/25/14 [phone]

## 2014-12-25 NOTE — Discharge Instructions (Signed)
I have printed your results - you should call your urologist to discuss these in the morning.  Please call your doctor for a followup appointment within 24-48 hours. When you talk to your doctor please let them know that you were seen in the emergency department and have them acquire all of your records so that they can discuss the findings with you and formulate a treatment plan to fully care for your new and ongoing problems.

## 2014-12-26 LAB — URINE CULTURE
COLONY COUNT: NO GROWTH
Culture: NO GROWTH

## 2017-06-27 ENCOUNTER — Emergency Department (HOSPITAL_COMMUNITY): Payer: Self-pay

## 2017-06-27 ENCOUNTER — Encounter (HOSPITAL_COMMUNITY): Payer: Self-pay | Admitting: *Deleted

## 2017-06-27 ENCOUNTER — Emergency Department (HOSPITAL_COMMUNITY)
Admission: EM | Admit: 2017-06-27 | Discharge: 2017-06-27 | Disposition: A | Discharge status: Home / Self Care | Payer: Self-pay | Attending: Emergency Medicine | Admitting: Emergency Medicine

## 2017-06-27 DIAGNOSIS — R300 Dysuria: Secondary | ICD-10-CM | POA: Insufficient documentation

## 2017-06-27 DIAGNOSIS — R112 Nausea with vomiting, unspecified: Secondary | ICD-10-CM | POA: Insufficient documentation

## 2017-06-27 DIAGNOSIS — Z960 Presence of urogenital implants: Secondary | ICD-10-CM | POA: Insufficient documentation

## 2017-06-27 DIAGNOSIS — M545 Low back pain: Secondary | ICD-10-CM | POA: Insufficient documentation

## 2017-06-27 DIAGNOSIS — F17210 Nicotine dependence, cigarettes, uncomplicated: Secondary | ICD-10-CM | POA: Insufficient documentation

## 2017-06-27 DIAGNOSIS — N12 Tubulo-interstitial nephritis, not specified as acute or chronic: Secondary | ICD-10-CM | POA: Insufficient documentation

## 2017-06-27 DIAGNOSIS — Z87442 Personal history of urinary calculi: Secondary | ICD-10-CM | POA: Insufficient documentation

## 2017-06-27 LAB — CBC WITH DIFFERENTIAL/PLATELET
Basophils Absolute: 0 10*3/uL (ref 0.0–0.1)
Basophils Relative: 0 %
Eosinophils Absolute: 0.1 10*3/uL (ref 0.0–0.7)
Eosinophils Relative: 1 %
HCT: 44 % (ref 39.0–52.0)
Hemoglobin: 15.1 g/dL (ref 13.0–17.0)
Lymphocytes Relative: 12 %
Lymphs Abs: 1.7 10*3/uL (ref 0.7–4.0)
MCH: 31.7 pg (ref 26.0–34.0)
MCHC: 34.3 g/dL (ref 30.0–36.0)
MCV: 92.2 fL (ref 78.0–100.0)
MONO ABS: 0.8 10*3/uL (ref 0.1–1.0)
Monocytes Relative: 6 %
Neutro Abs: 11.4 10*3/uL — ABNORMAL HIGH (ref 1.7–7.7)
Neutrophils Relative %: 81 %
Platelets: 284 10*3/uL (ref 150–400)
RBC: 4.77 MIL/uL (ref 4.22–5.81)
RDW: 13.7 % (ref 11.5–15.5)
WBC: 13.9 10*3/uL — ABNORMAL HIGH (ref 4.0–10.5)

## 2017-06-27 LAB — URINALYSIS, ROUTINE W REFLEX MICROSCOPIC
Bilirubin Urine: NEGATIVE
GLUCOSE, UA: NEGATIVE mg/dL
Ketones, ur: NEGATIVE mg/dL
NITRITE: POSITIVE — AB
Protein, ur: 100 mg/dL — AB
SPECIFIC GRAVITY, URINE: 1.015 (ref 1.005–1.030)
Squamous Epithelial / LPF: NONE SEEN
pH: 7 (ref 5.0–8.0)

## 2017-06-27 LAB — COMPREHENSIVE METABOLIC PANEL
ALT: 43 U/L (ref 17–63)
AST: 46 U/L — ABNORMAL HIGH (ref 15–41)
Albumin: 4.3 g/dL (ref 3.5–5.0)
Alkaline Phosphatase: 78 U/L (ref 38–126)
Anion gap: 9 (ref 5–15)
BILIRUBIN TOTAL: 0.7 mg/dL (ref 0.3–1.2)
BUN: 17 mg/dL (ref 6–20)
CO2: 25 mmol/L (ref 22–32)
CREATININE: 1.14 mg/dL (ref 0.61–1.24)
Calcium: 9.1 mg/dL (ref 8.9–10.3)
Chloride: 102 mmol/L (ref 101–111)
GFR calc Af Amer: >60 mL/min (ref >60)
GFR calc non Af Amer: >60 mL/min (ref >60)
Glucose, Bld: 186 mg/dL — ABNORMAL HIGH (ref 65–99)
POTASSIUM: 4.2 mmol/L (ref 3.5–5.1)
Sodium: 136 mmol/L (ref 135–145)
TOTAL PROTEIN: 8.6 g/dL — AB (ref 6.5–8.1)

## 2017-06-27 MED ORDER — ONDANSETRON HCL 4 MG/2ML IJ SOLN
4.0000 mg | Freq: Once | INTRAMUSCULAR | Status: AC
  Administered 2017-06-27: 4 mg via INTRAVENOUS
  Filled 2017-06-27: qty 2

## 2017-06-27 MED ORDER — HYDROMORPHONE HCL 1 MG/ML IJ SOLN
1.0000 mg | Freq: Once | INTRAMUSCULAR | Status: AC
  Administered 2017-06-27: 1 mg via INTRAVENOUS
  Filled 2017-06-27: qty 1

## 2017-06-27 MED ORDER — OXYCODONE-ACETAMINOPHEN 5-325 MG PO TABS: 1.0000 | ORAL_TABLET | Freq: Four times a day (QID) | ORAL | 0 refills | Status: DC | PRN

## 2017-06-27 MED ORDER — CEPHALEXIN 500 MG PO CAPS: ORAL_CAPSULE | ORAL | 0 refills | Status: DC

## 2017-06-27 MED ORDER — KETOROLAC TROMETHAMINE 30 MG/ML IJ SOLN
30.0000 mg | Freq: Once | INTRAMUSCULAR | Status: AC
  Administered 2017-06-27: 30 mg via INTRAVENOUS
  Filled 2017-06-27: qty 1

## 2017-06-27 MED ORDER — SODIUM CHLORIDE 0.9 % IV BOLUS (SEPSIS)
1000.0000 mL | Freq: Once | INTRAVENOUS | Status: AC
  Administered 2017-06-27: 1000 mL via INTRAVENOUS

## 2017-06-27 MED ORDER — DEXTROSE 5 % IV SOLN
1.0000 g | Freq: Once | INTRAVENOUS | Status: AC
  Administered 2017-06-27: 1 g via INTRAVENOUS
  Filled 2017-06-27: qty 10

## 2017-06-27 MED ORDER — OXYCODONE-ACETAMINOPHEN 5-325 MG PREPACK: 1.0000 | ORAL_TABLET | ORAL | 0 refills | Status: DC | PRN

## 2017-06-27 MED ORDER — HYDROGEN PEROXIDE 3 % EX SOLN
CUTANEOUS | Status: AC
  Filled 2017-06-27: qty 473

## 2017-06-27 MED ORDER — OXYCODONE-ACETAMINOPHEN 5-325 MG PO TABS
1.0000 | ORAL_TABLET | Freq: Once | ORAL | Status: AC
  Administered 2017-06-27: 1 via ORAL
  Filled 2017-06-27: qty 1

## 2017-06-27 NOTE — ED Provider Notes (Signed)
Emergency Department Provider Note   I have reviewed the triage vital signs and the nursing notes.   HISTORY  Chief Complaint Flank Pain   HPI [name] is a 51 y.o. male past medical history of kidney stones or presents to the emergency department today secondary to bilateral lower CVA pain. Patient states that he had stents placed 2 years ago and then went to jail never had them taken out. Her last couple days of progressively worsening lower back pain, dysuria and dark urine. This is similar to previous episodes of kidney stones and kidney infections. Has not seen a urologist in 2 years. Had had some nausea and vomiting and chills but no fevers. No chest pain, abdominal pain rashes or symptoms elsewhere. No other associated modifying symptoms. Has not taken anything to help with the symptoms.   Past Medical History:  Diagnosis Date  . Gout   . Kidney stones   . 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 Leticia Penna    There are no active problems to display for this patient.   Past Surgical History:  Procedure Laterality Date  . HERNIA REPAIR    . urteral stent      Current Outpatient Rx  . Order #: [id number] Class: Historical Med  . Order #: [id number] Class: Print  . Order #: [id number] Class: Print  . Order #: [id number] Class: Print    Allergies Bee venom  No family history on file.  Social History Social History  Substance Use Topics  . Smoking status: Current Every Day Smoker    Types: Cigarettes  . Smokeless tobacco: Never Used  . Alcohol use No     Comment: rarely    Review of Systems  All other systems negative except as documented in the HPI. All pertinent positives and negatives as reviewed in the HPI. ____________________________________________  PHYSICAL EXAM:  VITAL SIGNS: ED Triage Vitals  Enc Vitals Group     BP 06/27/17 1537 127/81     Pulse Rate 06/27/17 1537 81     Resp 06/27/17 1537 18     Temp 06/27/17 1537 (!) 97.3 F  (36.3 C)     Temp Source 06/27/17 1537 Temporal     SpO2 06/27/17 1537 95 %     Weight 06/27/17 1537 220 lb (99.8 kg)     Height 06/27/17 1537 5\' 11"  (1.803 m)     Head Circumference --      Peak Flow --      Pain Score 06/27/17 1536 9     Pain Loc --      Pain Edu? --      Excl. in GC? --     Constitutional: Alert and oriented. Well appearing and in no acute distress. Eyes: Conjunctivae are normal. PERRL. EOMI. Head: Atraumatic. Nose: No congestion/rhinnorhea. Mouth/Throat: Mucous membranes are moist.  Oropharynx non-erythematous. Neck: No stridor.  No meningeal signs.   Cardiovascular: Normal rate, regular rhythm. Good peripheral circulation. Grossly normal heart sounds.   Respiratory: Normal respiratory effort.  No retractions. Lungs CTAB. Gastrointestinal: Soft and nontender. No distention.  Musculoskeletal: No lower extremity tenderness nor edema. No gross deformities of extremities.does have mild tenderness and his lower back. Neurologic:  Normal speech and language. No gross focal neurologic deficits are appreciated.  Skin:  Skin is warm, dry and intact. No rash noted.  ____________________________________________   LABS (all labs ordered are listed, but only abnormal results are displayed)  Labs Reviewed  URINALYSIS, ROUTINE W REFLEX MICROSCOPIC - Abnormal; Notable  for the following:       Result Value   Color, Urine AMBER (*)    APPearance CLOUDY (*)    Hgb urine dipstick LARGE (*)    Protein, ur 100 (*)    Nitrite POSITIVE (*)    Leukocytes, UA LARGE (*)    Bacteria, UA RARE (*)    All other components within normal limits  CBC WITH DIFFERENTIAL/PLATELET - Abnormal; Notable for the following:    WBC 13.9 (*)    Neutro Abs 11.4 (*)    All other components within normal limits  COMPREHENSIVE METABOLIC PANEL - Abnormal; Notable for the following:    Glucose, Bld 186 (*)    Total Protein 8.6 (*)    AST 46 (*)    All other components within normal limits    URINE CULTURE   ____________________________________________  RADIOLOGY  Ct Renal Stone Study  Result Date: 06/27/2017 CLINICAL DATA:  Right-sided flank pain beginning yesterday. History of kidney stones. EXAM: CT ABDOMEN AND PELVIS WITHOUT CONTRAST TECHNIQUE: Multidetector CT imaging of the abdomen and pelvis was performed following the standard protocol without IV contrast. COMPARISON:  12/25/2014 FINDINGS: Lower chest: The visualized lung bases are clear. Hepatobiliary: No focal liver abnormality is seen. No gallstones, gallbladder wall thickening, or biliary dilatation. Pancreas: Unremarkable. Spleen: Unremarkable. Adrenals/Urinary Tract: Unremarkable adrenal glands. No right-sided renal calculi or hydronephrosis. A 9 mm low-density lesion in the lower pole of the right kidney likely represents a cyst. A left-sided double-J ureteral stent remains in place. A 1.7 cm stone adjacent to the stent at the left ureteropelvic junction is unchanged. There is slight distention of the left renal pelvis and calices compared to the prior CT without frank hydronephrosis. No left-sided calculi are seen more distally along the stent. The bladder is unremarkable. Stomach/Bowel: The stomach is within normal limits. There is no evidence of bowel obstruction or inflammation mild sigmoid colon diverticulosis is again noted. The appendix is unremarkable. Vascular/Lymphatic: Minimal aortic atherosclerosis. Subcentimeter retroperitoneal and porta hepatis lymph nodes are similar to the prior study. No enlarged lymph nodes are identified. Reproductive: Dystrophic calcifications in the prostate gland. Other: Unchanged small fat containing umbilical hernia. No intraperitoneal free fluid. Musculoskeletal: Mild lower thoracic and lumbar disc degeneration. No suspicious osseous lesion. IMPRESSION: 1. No evidence of right-sided urinary tract calculi or obstruction. 2. Unchanged large calculus at the left ureteropelvic junction with  ureteral stent remaining in place. Electronically Signed   By: Sebastian Ache M.D.   On: 06/27/2017 18:56   ____________________________________________   PROCEDURES  Procedure(s) performed:   Procedures ____________________________________________   INITIAL IMPRESSION / ASSESSMENT AND PLAN / ED COURSE  Pertinent labs & imaging results that were available during my care of the patient were reviewed by me and considered in my medical decision making (see chart for details).  Suspect likely UTI w/ or w/o stone. Needs at least to see urologist at some point as he probably needs stents removed.   Pyelonephritis with stents in place. Will start antibiotics, fu w/ urology. Pain controlled. Stable for dc.   ____________________________________________  FINAL CLINICAL IMPRESSION(S) / ED DIAGNOSES  Final diagnoses:  Pyelonephritis    MEDICATIONS GIVEN DURING THIS VISIT:  Medications  hydrogen peroxide 3 % external solution (not administered)  HYDROmorphone (DILAUDID) injection 1 mg (1 mg Intravenous Given 06/27/17 1822)  ondansetron (ZOFRAN) injection 4 mg (4 mg Intravenous Given 06/27/17 1822)  sodium chloride 0.9 % bolus 1,000 mL (0 mLs Intravenous Stopped 06/27/17 2142)  cefTRIAXone (ROCEPHIN)  1 g in dextrose 5 % 50 mL IVPB (0 g Intravenous Stopped 06/27/17 1902)  HYDROmorphone (DILAUDID) injection 1 mg (1 mg Intravenous Given 06/27/17 1931)  oxyCODONE-acetaminophen (PERCOCET/ROXICET) 5-325 MG per tablet 1 tablet (1 tablet Oral Given 06/27/17 2110)  ketorolac (TORADOL) 30 MG/ML injection 30 mg (30 mg Intravenous Given 06/27/17 2111)    NEW OUTPATIENT MEDICATIONS STARTED DURING THIS VISIT:  Discharge Medication List as of 06/27/2017  9:02 PM    START taking these medications   Details  cephALEXin (KEFLEX) 500 MG capsule 2 caps po bid x 7 days, Print    !! oxyCODONE-acetaminophen (PERCOCET) 5-325 MG tablet Take 1-2 tablets by mouth every 6 (six) hours as needed., Starting Tue 06/27/2017, Print     !! oxyCODONE-acetaminophen (PERCOCET) 5-325 mg TABS tablet Take 6 tablets by mouth every 4 (four) hours as needed., Starting Tue 06/27/2017, Print     !! - Potential duplicate medications found. Please discuss with provider.      Note:  This document was prepared using Dragon voice recognition software and may include unintentional dictation errors.    Mizael Sagar, Barbara CowerJason, MD 06/28/17 [phone]

## 2017-06-27 NOTE — ED Triage Notes (Signed)
Pt comes in with right sided flank pain starting yesterday. Pt has hx of kidney stones. Said he had some relief and thinks he passed a stone. Pt states pain is worse now, feels pain in his groin.

## 2017-06-30 LAB — URINE CULTURE

## 2017-07-01 ENCOUNTER — Telehealth: Payer: Self-pay | Admitting: Emergency Medicine

## 2017-07-01 NOTE — Telephone Encounter (Addendum)
Post ED Visit - Positive Culture Follow-up: Successful Patient Follow-Up  Culture assessed and recommendations reviewed by: []  Enzo BiNathan Batchelder, Pharm.D. []  Celedonio MiyamotoJeremy Frens, Pharm.D., BCPS AQ-ID []  Garvin FilaMike Maccia, Pharm.D., BCPS []  Georgina PillionElizabeth Martin, [address]Ph[address]D., BCPS []  LarrabeeMinh Pham, [address]Pharm[address]D., BCPS, AAHIVP []  Estella HuskMichelle Turner, Pharm.D., BCPS, AAHIVP [x]  Lysle Pearlachel Rumbarger, PharmD, BCPS []  Casilda Carlsaylor Stone, PharmD, BCPS []  Pollyann SamplesAndy Johnston, PharmD, BCPS  Positive urine culture  []  Patient discharged without antimicrobial prescription and treatment is now indicated [x]  Organism is resistant to prescribed ED discharge antimicrobial []  Patient with positive blood cultures  Changes discussed with ED provider: Netta CedarsMina Fawse PA New antibiotic prescription stop Cephalexin, start Bactrim DS 1 tablet po bid x 14 days, patient will f/u with urologist 1 week after antibiotics completed [phone]  Contacted patient, 07/01/2017 1350   Berle MullMiller, [name] 07/01/2017, 1:50 PM

## 2017-07-01 NOTE — Progress Notes (Signed)
ED Antimicrobial Stewardship Positive Culture Follow Up   [name] is an 51 y.o. male [facility] with a chief complaint of  Chief Complaint  Patient presents with  . Flank Pain    Recent Results (from the past 720 hour(s))  Urine Culture     Status: Abnormal   Collection Time: 06/27/17  3:39 PM  Result Value Ref Range Status   Specimen Description URINE, CLEAN CATCH  Final   Special Requests NONE  Final   Culture (A)  Final    >=100,000 COLONIES/mL METHICILLIN RESISTANT STAPHYLOCOCCUS AUREUS   Report Status 06/30/2017 FINAL  Final   Organism ID, Bacteria METHICILLIN RESISTANT STAPHYLOCOCCUS AUREUS (A)  Final      Susceptibility   Methicillin resistant staphylococcus aureus - MIC*    CIPROFLOXACIN <=0.5 SENSITIVE Sensitive     GENTAMICIN <=0.5 SENSITIVE Sensitive     NITROFURANTOIN <=16 SENSITIVE Sensitive     OXACILLIN >=4 RESISTANT Resistant     TETRACYCLINE <=1 SENSITIVE Sensitive     VANCOMYCIN <=0.5 SENSITIVE Sensitive     TRIMETH/SULFA <=10 SENSITIVE Sensitive     CLINDAMYCIN <=0.25 SENSITIVE Sensitive     RIFAMPIN <=0.5 SENSITIVE Sensitive     Inducible Clindamycin NEGATIVE Sensitive     * >=100,000 COLONIES/mL METHICILLIN RESISTANT STAPHYLOCOCCUS AUREUS    [x]  Treated with cephalexin, organism resistant to prescribed antimicrobial []  Patient discharged originally without antimicrobial agent and treatment is now indicated  New antibiotic prescription: DC cephalexin, start bactrim DS 1 tablet PO BID x 14 days, recommend rechecking urine culture 1 week after antibiotics completed  ED Provider: Michela PitcherMina Fawze, PA   Diann Bangerter, Drake Leachachel Lynn 07/01/2017, 10:19 AM Clinical Pharmacist Phone# [phone]

## 2017-07-03 MED FILL — Oxycodone w/ Acetaminophen Tab 5-325 MG: ORAL | Qty: 6 | Status: AC

## 2017-07-05 ENCOUNTER — Other Ambulatory Visit (HOSPITAL_COMMUNITY): Payer: Self-pay | Admitting: Urology

## 2017-07-05 ENCOUNTER — Other Ambulatory Visit: Payer: Self-pay | Admitting: Urology

## 2017-07-05 DIAGNOSIS — Z87442 Personal history of urinary calculi: Secondary | ICD-10-CM

## 2017-07-08 ENCOUNTER — Encounter (HOSPITAL_COMMUNITY): Payer: Self-pay | Admitting: Emergency Medicine

## 2017-07-08 ENCOUNTER — Emergency Department (HOSPITAL_COMMUNITY): Payer: Self-pay

## 2017-07-08 ENCOUNTER — Emergency Department (HOSPITAL_COMMUNITY)
Admission: EM | Admit: 2017-07-08 | Discharge: 2017-07-08 | Disposition: A | Discharge status: Home / Self Care | Payer: Self-pay | Attending: Emergency Medicine | Admitting: Emergency Medicine

## 2017-07-08 DIAGNOSIS — Z79899 Other long term (current) drug therapy: Secondary | ICD-10-CM | POA: Insufficient documentation

## 2017-07-08 DIAGNOSIS — R109 Unspecified abdominal pain: Secondary | ICD-10-CM

## 2017-07-08 DIAGNOSIS — R1032 Left lower quadrant pain: Secondary | ICD-10-CM | POA: Insufficient documentation

## 2017-07-08 DIAGNOSIS — F17210 Nicotine dependence, cigarettes, uncomplicated: Secondary | ICD-10-CM | POA: Insufficient documentation

## 2017-07-08 LAB — CBC WITH DIFFERENTIAL/PLATELET
BASOS PCT: 1 %
Basophils Absolute: 0.1 10*3/uL (ref 0.0–0.1)
EOS ABS: 0.3 10*3/uL (ref 0.0–0.7)
EOS PCT: 5 %
HCT: 35.9 % — ABNORMAL LOW (ref 39.0–52.0)
Hemoglobin: 12 g/dL — ABNORMAL LOW (ref 13.0–17.0)
Lymphocytes Relative: 35 %
Lymphs Abs: 2.5 10*3/uL (ref 0.7–4.0)
MCH: 30.9 pg (ref 26.0–34.0)
MCHC: 33.4 g/dL (ref 30.0–36.0)
MCV: 92.5 fL (ref 78.0–100.0)
MONOS PCT: 12 %
Monocytes Absolute: 0.8 10*3/uL (ref 0.1–1.0)
NEUTROS PCT: 47 %
Neutro Abs: 3.4 10*3/uL (ref 1.7–7.7)
PLATELETS: 289 10*3/uL (ref 150–400)
RBC: 3.88 MIL/uL — ABNORMAL LOW (ref 4.22–5.81)
RDW: 13.5 % (ref 11.5–15.5)
WBC: 7.1 10*3/uL (ref 4.0–10.5)

## 2017-07-08 LAB — URINALYSIS, ROUTINE W REFLEX MICROSCOPIC
Bacteria, UA: NONE SEEN
Bilirubin Urine: NEGATIVE
GLUCOSE, UA: NEGATIVE mg/dL
Ketones, ur: NEGATIVE mg/dL
NITRITE: POSITIVE — AB
PH: 8 (ref 5.0–8.0)
Protein, ur: 30 mg/dL — AB
SPECIFIC GRAVITY, URINE: 1.014 (ref 1.005–1.030)

## 2017-07-08 LAB — BASIC METABOLIC PANEL
Anion gap: 9 (ref 5–15)
BUN: 17 mg/dL (ref 6–20)
CALCIUM: 8.6 mg/dL — AB (ref 8.9–10.3)
CHLORIDE: 101 mmol/L (ref 101–111)
CO2: 26 mmol/L (ref 22–32)
CREATININE: 1.08 mg/dL (ref 0.61–1.24)
Glucose, Bld: 104 mg/dL — ABNORMAL HIGH (ref 65–99)
Potassium: 4.5 mmol/L (ref 3.5–5.1)
SODIUM: 136 mmol/L (ref 135–145)

## 2017-07-08 MED ORDER — HYDROMORPHONE HCL 1 MG/ML IJ SOLN
INTRAMUSCULAR | Status: AC
  Administered 2017-07-08: 1 mg via INTRAVENOUS
  Filled 2017-07-08: qty 1

## 2017-07-08 MED ORDER — ONDANSETRON 4 MG PO TBDP: ORAL_TABLET | ORAL | 0 refills | Status: DC

## 2017-07-08 MED ORDER — HYDROMORPHONE HCL 1 MG/ML IJ SOLN
1.0000 mg | Freq: Once | INTRAMUSCULAR | Status: AC
  Administered 2017-07-08: 1 mg via INTRAVENOUS
  Filled 2017-07-08: qty 1

## 2017-07-08 MED ORDER — ONDANSETRON HCL 4 MG/2ML IJ SOLN
4.0000 mg | Freq: Once | INTRAMUSCULAR | Status: AC
  Administered 2017-07-08: 4 mg via INTRAVENOUS
  Filled 2017-07-08: qty 2

## 2017-07-08 MED ORDER — HYDROMORPHONE HCL 1 MG/ML IJ SOLN
1.0000 mg | Freq: Once | INTRAMUSCULAR | Status: AC
  Administered 2017-07-08: 1 mg via INTRAVENOUS

## 2017-07-08 MED ORDER — HYDROMORPHONE HCL 4 MG PO TABS: 4.0000 mg | ORAL_TABLET | ORAL | 0 refills | Status: DC | PRN

## 2017-07-08 MED ORDER — CIPROFLOXACIN HCL 500 MG PO TABS: 500.0000 mg | ORAL_TABLET | Freq: Two times a day (BID) | ORAL | 0 refills | Status: DC

## 2017-07-08 MED ORDER — DEXTROSE 5 % IV SOLN
1.0000 g | Freq: Once | INTRAVENOUS | Status: AC
  Administered 2017-07-08: 1 g via INTRAVENOUS
  Filled 2017-07-08: qty 10

## 2017-07-08 NOTE — ED Triage Notes (Signed)
Patient c/o left flank pain that started yesterday. Per patient was seen here in ER x1 week ago and diagnosed with kidney stone in which requires surgery. Patient states surgery is October 8th. Patient states "I was doing better after I came here but then I was working on a bobcat yesterday and I don't know if I jared it or what." Per patient nausea and vomiting. Patient reports took last dose of percocet 2 days ago.

## 2017-07-08 NOTE — ED Notes (Signed)
Patient transported to X-ray 

## 2017-07-08 NOTE — ED Provider Notes (Signed)
[facility]-EMERGENCY DEPT Provider Note   CSN: [id number] Arrival date & time: 07/08/17  1352     History   Chief Complaint Chief Complaint  Patient presents with  . Flank Pain    HPI [name] is a 51 y.o. male.  Patient complains of severe left flank pain. He has a stent in the known large kidney stone on the left. He is scheduled for surgery in a couple weeks.   The history is provided by the patient.  Flank Pain  This is a recurrent problem. The current episode started yesterday. The problem occurs constantly. The problem has not changed since onset.Pertinent negatives include no chest pain, no abdominal pain and no headaches. Nothing aggravates the symptoms. Nothing relieves the symptoms.    Past Medical History:  Diagnosis Date  . Gout   . Kidney stones   . 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 Leticia Penna    There are no active problems to display for this patient.   Past Surgical History:  Procedure Laterality Date  . HERNIA REPAIR    . urteral stent         Home Medications    Prior to Admission medications   Medication Sig Start Date End Date Taking? Authorizing Provider  indomethacin (INDOCIN) 50 MG capsule Take 50 mg by mouth 3 (three) times daily as needed.   Yes [provider]  cephALEXin (KEFLEX) 500 MG capsule 2 caps po bid x 7 days Patient not taking: Reported on 07/08/2017 06/27/17   Mesner, Barbara Cower, MD  ciprofloxacin (CIPRO) 500 MG tablet Take 1 tablet (500 mg total) by mouth 2 (two) times daily. One po bid x 7 days 07/08/17   Bethann Berkshire, MD  HYDROmorphone (DILAUDID) 4 MG tablet Take 1 tablet (4 mg total) by mouth every 4 (four) hours as needed for severe pain. 07/08/17   Bethann Berkshire, MD  ondansetron (ZOFRAN ODT) 4 MG disintegrating tablet  ODT q4 hours prn nausea/vomit 07/08/17   Bethann Berkshire, MD  oxyCODONE-acetaminophen (PERCOCET) 5-325 MG tablet Take 1-2 tablets by mouth every 6 (six) hours as needed. Patient not taking:  Reported on 07/08/2017 06/27/17   Mesner, Barbara Cower, MD  oxyCODONE-acetaminophen (PERCOCET) 5-325 mg TABS tablet Take 6 tablets by mouth every 4 (four) hours as needed. Patient not taking: Reported on 07/08/2017 06/27/17   Mesner, Barbara Cower, MD    Family History No family history on file.  Social History Social History  Substance Use Topics  . Smoking status: Current Every Day Smoker    Packs/day: 0.50    Types: Cigarettes  . Smokeless tobacco: Never Used  . Alcohol use No     Allergies   Bee venom   Review of Systems Review of Systems  Constitutional: Negative for appetite change and fatigue.  HENT: Negative for congestion, ear discharge and sinus pressure.   Eyes: Negative for discharge.  Respiratory: Negative for cough.   Cardiovascular: Negative for chest pain.  Gastrointestinal: Positive for nausea. Negative for abdominal pain and diarrhea.  Genitourinary: Positive for flank pain. Negative for frequency and hematuria.  Musculoskeletal: Negative for back pain.  Skin: Negative for rash.  Neurological: Negative for seizures and headaches.  Psychiatric/Behavioral: Negative for hallucinations.     Physical Exam Updated Vital Signs BP (!) 118/94 (BP Location: Right Arm)   Pulse (!) 56   Temp 97.9 F (36.6 C) (Oral)   Resp 18   Ht  (1.803 m)   Wt 99.8 kg (220 lb)   SpO2  98%   BMI 30.68 kg/m   Physical Exam  Constitutional: He is oriented to person, place, and time. He appears well-developed.  HENT:  Head: Normocephalic.  Eyes: Conjunctivae and EOM are normal. No scleral icterus.  Neck: Neck supple. No thyromegaly present.  Cardiovascular: Normal rate and regular rhythm.  Exam reveals no gallop and no friction rub.   No murmur heard. Pulmonary/Chest: No stridor. He has no wheezes. He has no rales. He exhibits no tenderness.  Abdominal: He exhibits no distension. There is no tenderness. There is no rebound.  Genitourinary:  Genitourinary Comments: Tender left flank    Musculoskeletal: Normal range of motion. He exhibits no edema.  Lymphadenopathy:    He has no cervical adenopathy.  Neurological: He is oriented to person, place, and time. He exhibits normal muscle tone. Coordination normal.  Skin: No rash noted. No erythema.  Psychiatric: He has a normal mood and affect. His behavior is normal.     ED Treatments / Results  Labs (all labs ordered are listed, but only abnormal results are displayed) Labs Reviewed  CBC WITH DIFFERENTIAL/PLATELET - Abnormal; Notable for the following:       Result Value   RBC 3.88 (*)    Hemoglobin 12.0 (*)    HCT 35.9 (*)    All other components within normal limits  BASIC METABOLIC PANEL - Abnormal; Notable for the following:    Glucose, Bld 104 (*)    Calcium 8.6 (*)    All other components within normal limits  URINALYSIS, ROUTINE W REFLEX MICROSCOPIC - Abnormal; Notable for the following:    APPearance CLOUDY (*)    Hgb urine dipstick MODERATE (*)    Protein, ur 30 (*)    Nitrite POSITIVE (*)    Leukocytes, UA LARGE (*)    Squamous Epithelial / LPF 0-5 (*)    All other components within normal limits  URINE CULTURE    EKG  EKG Interpretation None       Radiology Dg Abdomen 1 View  Result Date: 07/08/2017 CLINICAL DATA:  Fever and nausea and pain.  Left kidney stone. EXAM: ABDOMEN - 1 VIEW COMPARISON:  06/27/2017. FINDINGS: Left-sided nephroureteral stent is identified. Large stone at the left UPJ measures 2.2 x 1.6 cm. The bowel gas pattern appears normal. IMPRESSION: 1. Normal bowel gas pattern. 2. Left UPJ calculus with left-sided nephroureteral stent in place. Electronically Signed   By: Signa Kell M.D.   On: 07/08/2017 15:44    Procedures Procedures (including critical care time)  Medications Ordered in ED Medications  HYDROmorphone (DILAUDID) injection 1 mg (1 mg Intravenous Given 07/08/17 1514)  ondansetron (ZOFRAN) injection 4 mg (4 mg Intravenous Given 07/08/17 1514)  HYDROmorphone  (DILAUDID) injection 1 mg (1 mg Intravenous Given 07/08/17 1611)  ondansetron (ZOFRAN) injection 4 mg (4 mg Intravenous Given 07/08/17 1611)  cefTRIAXone (ROCEPHIN) 1 g in dextrose 5 % 50 mL IVPB (1 g Intravenous New Bag/Given 07/08/17 1653)     Initial Impression / Assessment and Plan / ED Course  I have reviewed the triage vital signs and the nursing notes.  Pertinent labs & imaging results that were available during my care of the patient were reviewed by me and considered in my medical decision making (see chart for details).     Patient with a kidney stone the (urinary tract infection. Patient already has a stent in place. I spoke with urology and they felt comfortable discharging the patient on Cipro with close follow-up  Final  Clinical Impressions(s) / ED Diagnoses   Final diagnoses:  Left flank pain    New Prescriptions New Prescriptions   CIPROFLOXACIN (CIPRO) 500 MG TABLET    Take 1 tablet (500 mg total) by mouth 2 (two) times daily. One po bid x 7 days   HYDROMORPHONE (DILAUDID) 4 MG TABLET    Take 1 tablet (4 mg total) by mouth every 4 (four) hours as needed for severe pain.   ONDANSETRON (ZOFRAN ODT) 4 MG DISINTEGRATING TABLET     ODT q4 hours prn nausea/vomit     Bethann Berkshire, MD 07/08/17 1744

## 2017-07-08 NOTE — ED Notes (Signed)
MD at bedside. 

## 2017-07-08 NOTE — Discharge Instructions (Signed)
Follow up with your urologist next week.  If you have problems tomorrow then go to [facility] in [place]

## 2017-07-10 LAB — URINE CULTURE

## 2017-07-19 NOTE — Patient Instructions (Signed)
[name]  07/19/2017   Your procedure is scheduled on: 07/31/2017    Report to [facility] Main  Entrance and go to Radiology at 0730am then  Take [facility]  elevators to 3rd floor to  Short Stay Center at    0730 AM.    Call this number if you have problems the morning of surgery [phone]    Remember: ONLY 1 PERSON MAY GO WITH YOU TO SHORT STAY TO GET  READY MORNING OF YOUR SURGERY.  Do not eat food or drink liquids :After Midnight.     Take these medicines the morning of surgery with A SIP OF WATER: Dilaudid if needed                                 You may not have any metal on your body including hair pins and              piercings  Do not wear jewelry,  lotions, powders or perfumes, deodorant                         Men may shave face and neck.   Do not bring valuables to the hospital. [facility] IS NOT             RESPONSIBLE   FOR VALUABLES.  Contacts, dentures or bridgework may not be worn into surgery.  Leave suitcase in the car. After surgery it may be brought to your room.                 Please read over the following fact sheets you were given: _____________________________________________________________________             [facility] - Preparing for Surgery Before surgery, you can play an important role.  Because skin is not sterile, your skin needs to be as free of germs as possible.  You can reduce the number of germs on your skin by washing with CHG (chlorahexidine gluconate) soap before surgery.  CHG is an antiseptic cleaner which kills germs and bonds with the skin to continue killing germs even after washing. Please DO NOT use if you have an allergy to CHG or antibacterial soaps.  If your skin becomes reddened/irritated stop using the CHG and inform your nurse when you arrive at Short Stay. Do not shave (including legs and underarms) for at least 48 hours prior to the first CHG shower.  You may shave your face/neck. Please  follow these instructions carefully:  1.  Shower with CHG Soap the night before surgery and the  morning of Surgery.  2.  If you choose to wash your hair, wash your hair first as usual with your  normal  shampoo.  3.  After you shampoo, rinse your hair and body thoroughly to remove the  shampoo.                           4.  Use CHG as you would any other liquid soap.  You can apply chg directly  to the skin and wash                       Gently with a scrungie or clean washcloth.  5.  Apply the  CHG Soap to your body ONLY FROM THE NECK DOWN.   Do not use on face/ open                           Wound or open sores. Avoid contact with eyes, ears mouth and genitals (private parts).                       Wash face,  Genitals (private parts) with your normal soap.             6.  Wash thoroughly, paying special attention to the area where your surgery  will be performed.  7.  Thoroughly rinse your body with warm water from the neck down.  8.  DO NOT shower/wash with your normal soap after using and rinsing off  the CHG Soap.                9.  Pat yourself dry with a clean towel.            10.  Wear clean pajamas.            11.  Place clean sheets on your bed the night of your first shower and do not  sleep with pets. Day of Surgery : Do not apply any lotions/deodorants the morning of surgery.  Please wear clean clothes to the hospital/surgery center.  FAILURE TO FOLLOW THESE INSTRUCTIONS MAY RESULT IN THE CANCELLATION OF YOUR SURGERY PATIENT SIGNATURE_________________________________  NURSE SIGNATURE__________________________________  ________________________________________________________________________

## 2017-07-20 ENCOUNTER — Emergency Department (HOSPITAL_COMMUNITY)
Admission: EM | Admit: 2017-07-20 | Discharge: 2017-07-20 | Disposition: A | Discharge status: Home / Self Care | Payer: Self-pay | Attending: Emergency Medicine | Admitting: Emergency Medicine

## 2017-07-20 ENCOUNTER — Encounter (HOSPITAL_COMMUNITY): Payer: Self-pay | Admitting: Emergency Medicine

## 2017-07-20 DIAGNOSIS — F17210 Nicotine dependence, cigarettes, uncomplicated: Secondary | ICD-10-CM | POA: Insufficient documentation

## 2017-07-20 DIAGNOSIS — R300 Dysuria: Secondary | ICD-10-CM | POA: Insufficient documentation

## 2017-07-20 DIAGNOSIS — R319 Hematuria, unspecified: Secondary | ICD-10-CM | POA: Insufficient documentation

## 2017-07-20 DIAGNOSIS — R109 Unspecified abdominal pain: Secondary | ICD-10-CM

## 2017-07-20 DIAGNOSIS — N200 Calculus of kidney: Secondary | ICD-10-CM | POA: Insufficient documentation

## 2017-07-20 DIAGNOSIS — Z791 Long term (current) use of non-steroidal anti-inflammatories (NSAID): Secondary | ICD-10-CM | POA: Insufficient documentation

## 2017-07-20 LAB — URINALYSIS, ROUTINE W REFLEX MICROSCOPIC
Bilirubin Urine: NEGATIVE
GLUCOSE, UA: NEGATIVE mg/dL
KETONES UR: NEGATIVE mg/dL
NITRITE: POSITIVE — AB
PH: 6 (ref 5.0–8.0)
PROTEIN: 100 mg/dL — AB
Specific Gravity, Urine: 1.013 (ref 1.005–1.030)
Squamous Epithelial / LPF: NONE SEEN

## 2017-07-20 MED ORDER — OXYCODONE-ACETAMINOPHEN 5-325 MG PO TABS
1.0000 | ORAL_TABLET | Freq: Once | ORAL | Status: AC
  Administered 2017-07-20: 1 via ORAL
  Filled 2017-07-20: qty 1

## 2017-07-20 MED ORDER — SULFAMETHOXAZOLE-TRIMETHOPRIM 800-160 MG PO TABS: 1.0000 | ORAL_TABLET | Freq: Two times a day (BID) | ORAL | 0 refills | Status: AC

## 2017-07-20 MED ORDER — KETOROLAC TROMETHAMINE 10 MG PO TABS: 10.0000 mg | ORAL_TABLET | Freq: Four times a day (QID) | ORAL | 0 refills | Status: DC | PRN

## 2017-07-20 MED ORDER — ONDANSETRON HCL 4 MG/2ML IJ SOLN
4.0000 mg | Freq: Once | INTRAMUSCULAR | Status: AC
  Administered 2017-07-20: 4 mg via INTRAVENOUS
  Filled 2017-07-20: qty 2

## 2017-07-20 MED ORDER — KETOROLAC TROMETHAMINE 30 MG/ML IJ SOLN
30.0000 mg | Freq: Once | INTRAMUSCULAR | Status: DC
  Filled 2017-07-20: qty 1

## 2017-07-20 MED ORDER — HYDROMORPHONE HCL 1 MG/ML IJ SOLN
1.0000 mg | Freq: Once | INTRAMUSCULAR | Status: AC
  Administered 2017-07-20: 1 mg via INTRAVENOUS
  Filled 2017-07-20: qty 1

## 2017-07-20 NOTE — Discharge Instructions (Signed)
Take the antibiotic as directed until your procedure. Be sure to keep your appointment for October 8.

## 2017-07-20 NOTE — ED Notes (Signed)
Pt upset concerning prescription pain med. Pt had refused IV toradol and stated that he didn't want prescription for toradol for home use.  Instructed pt to take antibiotic for infection.

## 2017-07-20 NOTE — ED Provider Notes (Signed)
[facility]-EMERGENCY DEPT Provider Note   CSN: [id number] Arrival date & time: 07/20/17  1007     History   Chief Complaint Chief Complaint  Patient presents with  . Flank Pain    HPI [name] is a 51 y.o. male.  HPI   [name] is a 51 y.o. male with hx of kidney stones and presents to the Emergency Department complaining of persistent, worsening left flank pain.  He has been here several times this month for same.  Had stents placed 2 years ago and stents were never removed.  He was seen here previously and diagnosed with pyelonephritis and completed two courses of antibiotics. Continues to have burning with urination as well.  Has a known large left kidney stone at the UVJ and surgery is scheduled by Dr Vernie Ammons on October 8.  Patient was given Dilaudid tabs for pain on 9/15 which he has ran out.     Past Medical History:  Diagnosis Date  . Gout   . Kidney stones   . 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 Leticia Penna    There are no active problems to display for this patient.   Past Surgical History:  Procedure Laterality Date  . HERNIA REPAIR    . urteral stent         Home Medications    Prior to Admission medications   Medication Sig Start Date End Date Taking? Authorizing Provider  cephALEXin (KEFLEX) 500 MG capsule 2 caps po bid x 7 days Patient not taking: Reported on 07/08/2017 06/27/17   Mesner, Barbara Cower, MD  ciprofloxacin (CIPRO) 500 MG tablet Take 1 tablet (500 mg total) by mouth 2 (two) times daily. One po bid x 7 days 07/08/17   Bethann Berkshire, MD  HYDROmorphone (DILAUDID) 4 MG tablet Take 1 tablet (4 mg total) by mouth every 4 (four) hours as needed for severe pain. 07/08/17   Bethann Berkshire, MD  indomethacin (INDOCIN) 50 MG capsule Take 50 mg by mouth 3 (three) times daily as needed.    [provider]  ondansetron (ZOFRAN ODT) 4 MG disintegrating tablet  ODT q4 hours prn nausea/vomit 07/08/17   Bethann Berkshire, MD    oxyCODONE-acetaminophen (PERCOCET) 5-325 MG tablet Take 1-2 tablets by mouth every 6 (six) hours as needed. Patient not taking: Reported on 07/08/2017 06/27/17   Mesner, Barbara Cower, MD  oxyCODONE-acetaminophen (PERCOCET) 5-325 mg TABS tablet Take 6 tablets by mouth every 4 (four) hours as needed. Patient not taking: Reported on 07/08/2017 06/27/17   Mesner, Barbara Cower, MD    Family History No family history on file.  Social History Social History  Substance Use Topics  . Smoking status: Current Every Day Smoker    Packs/day: 0.50    Types: Cigarettes  . Smokeless tobacco: Never Used  . Alcohol use No     Allergies   Bee venom   Review of Systems Review of Systems  Constitutional: Negative for chills and fever.  Respiratory: Negative for chest tightness and shortness of breath.   Cardiovascular: Negative for chest pain.  Gastrointestinal: Negative for abdominal pain, nausea and vomiting.  Genitourinary: Positive for dysuria, flank pain and hematuria. Negative for discharge, penile swelling, scrotal swelling and testicular pain.  Skin: Negative for color change.  Neurological: Negative for weakness.     Physical Exam Updated Vital Signs BP 134/78   Pulse 68   Temp 98 F (36.7 C)   Resp (!) 22   Ht  (1.803 m)  Wt 99.8 kg (220 lb)   SpO2 95%   BMI 30.68 kg/m   Physical Exam  Constitutional: He is oriented to person, place, and time. He appears well-developed and well-nourished.  HENT:  Head: Atraumatic.  Cardiovascular: Normal rate, regular rhythm and intact distal pulses.   Pulmonary/Chest: Effort normal. No respiratory distress.  Abdominal: Soft. He exhibits no distension and no mass. There is no tenderness. There is no rebound and no guarding.  Left flank pain with mild CVA tenderness  Musculoskeletal: Normal range of motion.  Neurological: He is alert and oriented to person, place, and time. No sensory deficit.  Skin: Skin is warm. Capillary refill takes less than  2 seconds.  Nursing note and vitals reviewed.    ED Treatments / Results  Labs (all labs ordered are listed, but only abnormal results are displayed) Labs Reviewed  URINE CULTURE - Abnormal; Notable for the following:       Result Value   Culture >=100,000 COLONIES/mL STAPHYLOCOCCUS AUREUS (*)    All other components within normal limits  URINALYSIS, ROUTINE W REFLEX MICROSCOPIC - Abnormal; Notable for the following:    APPearance CLOUDY (*)    Hgb urine dipstick LARGE (*)    Protein, ur 100 (*)    Nitrite POSITIVE (*)    Leukocytes, UA LARGE (*)    Bacteria, UA RARE (*)    All other components within normal limits    EKG  EKG Interpretation None       Radiology No results found.  Procedures Procedures (including critical care time)  Medications Ordered in ED Medications  HYDROmorphone (DILAUDID) injection 1 mg (1 mg Intravenous Given 07/20/17 1052)  ondansetron (ZOFRAN) injection 4 mg (4 mg Intravenous Given 07/20/17 1052)  HYDROmorphone (DILAUDID) injection 1 mg (1 mg Intravenous Given 07/20/17 1135)  oxyCODONE-acetaminophen (PERCOCET/ROXICET) 5-325 MG per tablet 1 tablet (1 tablet Oral Given 07/20/17 1341)     Initial Impression / Assessment and Plan / ED Course  I have reviewed the triage vital signs and the nursing notes.  Pertinent labs & imaging results that were available during my care of the patient were reviewed by me and considered in my medical decision making (see chart for details).     Initial IV attempted by nursing, but infiltrated when medication pushed.  Pt reports getting minimal relief.  Will have nursing attempt another IV. Pain improved after IV dilaudid,   Pt seen here several times for same.  Has received multiple narcotic medications this month.  Requesting additional pain medication.    1310  Consulted urology, Dr. Alvester Morin, who recommends Bactrim x 2 weeks, urine culture pending and pain addressed with Toradol.  I do not feel additional  narcotic medications are indicated.  Pt has urological procedure scheduled for 10/8.  Appears stable for d/c   Final Clinical Impressions(s) / ED Diagnoses   Final diagnoses:  Left flank pain  Kidney stone    New Prescriptions New Prescriptions   No medications on file     Pauline Aus, Cordelia Poche 07/22/17 2305    Lavera Guise, MD 07/27/17 1409

## 2017-07-20 NOTE — ED Triage Notes (Signed)
Pt c/o continued bilateral flank pain.

## 2017-07-23 LAB — URINE CULTURE

## 2017-07-24 ENCOUNTER — Telehealth: Payer: Self-pay | Admitting: Emergency Medicine

## 2017-07-24 ENCOUNTER — Encounter (HOSPITAL_COMMUNITY)
Admission: RE | Admit: 2017-07-24 | Discharge: 2017-07-24 | Disposition: A | Discharge status: Home / Self Care | Payer: Self-pay | Source: Ambulatory Visit | Attending: Urology | Admitting: Urology

## 2017-07-24 NOTE — Telephone Encounter (Signed)
Post ED Visit - Positive Culture Follow-up  Culture report reviewed by antimicrobial stewardship pharmacist:   Enzo Bi, Pharm.D.  Celedonio Miyamoto, Pharm.D., BCPS AQ-ID  Garvin Fila, Pharm.D., BCPS  Georgina Pillion, Pharm.D., BCPS  [place], [address].D., BCPS, AAHIVP  Estella Husk, Pharm.D., BCPS, AAHIVP  Lysle Pearl, PharmD, BCPS  Casilda Carls, PharmD, BCPS  Pollyann Samples, PharmD, BCPS  Positive urine culture Treated with sulfamethoxazole-Trimethoprim DS, organism sensitive to the same and no further patient follow-up is required at this time.  Berle Mull 07/24/2017, 1:30 PM

## 2017-07-28 ENCOUNTER — Other Ambulatory Visit: Payer: Self-pay | Admitting: Radiology

## 2017-07-28 ENCOUNTER — Encounter (HOSPITAL_COMMUNITY)
Admission: RE | Admit: 2017-07-28 | Discharge: 2017-07-28 | Disposition: A | Discharge status: Home / Self Care | Payer: Self-pay | Source: Ambulatory Visit | Attending: Urology | Admitting: Urology

## 2017-07-28 ENCOUNTER — Encounter (HOSPITAL_COMMUNITY): Payer: Self-pay

## 2017-07-28 DIAGNOSIS — N200 Calculus of kidney: Secondary | ICD-10-CM | POA: Insufficient documentation

## 2017-07-28 DIAGNOSIS — Z01818 Encounter for other preprocedural examination: Secondary | ICD-10-CM | POA: Insufficient documentation

## 2017-07-28 LAB — CBC
HCT: 38.3 % — ABNORMAL LOW (ref 39.0–52.0)
HEMOGLOBIN: 13 g/dL (ref 13.0–17.0)
MCH: 31 pg (ref 26.0–34.0)
MCHC: 33.9 g/dL (ref 30.0–36.0)
MCV: 91.4 fL (ref 78.0–100.0)
PLATELETS: 323 10*3/uL (ref 150–400)
RBC: 4.19 MIL/uL — ABNORMAL LOW (ref 4.22–5.81)
RDW: 13.6 % (ref 11.5–15.5)
WBC: 11 10*3/uL — ABNORMAL HIGH (ref 4.0–10.5)

## 2017-07-28 LAB — BASIC METABOLIC PANEL
Anion gap: 10 (ref 5–15)
BUN: 17 mg/dL (ref 6–20)
CALCIUM: 9 mg/dL (ref 8.9–10.3)
CHLORIDE: 95 mmol/L — AB (ref 101–111)
CO2: 26 mmol/L (ref 22–32)
CREATININE: 1.03 mg/dL (ref 0.61–1.24)
GFR calc Af Amer: >60 mL/min (ref >60)
GFR calc non Af Amer: >60 mL/min (ref >60)
GLUCOSE: 102 mg/dL — AB (ref 65–99)
Potassium: 4.2 mmol/L (ref 3.5–5.1)
Sodium: 131 mmol/L — ABNORMAL LOW (ref 135–145)

## 2017-07-28 NOTE — Progress Notes (Signed)
CbcDIFF, BMP 07-08-17 EPIC

## 2017-07-28 NOTE — Patient Instructions (Addendum)
[name]  07/28/2017   Your procedure is scheduled on: 07-31-17  Report to [facility] Main  Entrance and go to Radiology Department on the first floor for check in at 7:30AM . Then take [place]  elevators to   3rd floor to  [employer].    Call this number if you have problems the morning of surgery [phone]    Remember: ONLY 1 PERSON MAY GO WITH YOU TO SHORT STAY TO GET  READY MORNING OF YOUR SURGERY.    Do not eat food or drink liquids :After Midnight.     Take these medicines the morning of surgery with A SIP OF WATER: BACTRIM                                You may not have any metal on your body including hair pins and              piercings  Do not wear jewelry, make-up, lotions, powders or perfumes, deodorant                     Men may shave face and neck.   Do not bring valuables to the hospital. [facility] IS NOT             RESPONSIBLE   FOR VALUABLES.  Contacts, dentures or bridgework may not be worn into surgery.  Leave suitcase in the car. After surgery it may be brought to your room.               Please read over the following fact sheets you were given: _____________________________________________________________________             [facility] - Preparing for Surgery Before surgery, you can play an important role.  Because skin is not sterile, your skin needs to be as free of germs as possible.  You can reduce the number of germs on your skin by washing with CHG (chlorahexidine gluconate) soap before surgery.  CHG is an antiseptic cleaner which kills germs and bonds with the skin to continue killing germs even after washing. Please DO NOT use if you have an allergy to CHG or antibacterial soaps.  If your skin becomes reddened/irritated stop using the CHG and inform your nurse when you arrive at Short Stay. Do not shave (including legs and underarms) for at least 48 hours prior to the first CHG shower.  You may shave your  face/neck. Please follow these instructions carefully:  1.  Shower with CHG Soap the night before surgery and the  morning of Surgery.  2.  If you choose to wash your hair, wash your hair first as usual with your  normal  shampoo.  3.  After you shampoo, rinse your hair and body thoroughly to remove the  shampoo.                           4.  Use CHG as you would any other liquid soap.  You can apply chg directly  to the skin and wash                       Gently with a scrungie or clean washcloth.  5.  Apply the CHG Soap to your body  ONLY FROM THE NECK DOWN.   Do not use on face/ open                           Wound or open sores. Avoid contact with eyes, ears mouth and genitals (private parts).                       Wash face,  Genitals (private parts) with your normal soap.             6.  Wash thoroughly, paying special attention to the area where your surgery  will be performed.  7.  Thoroughly rinse your body with warm water from the neck down.  8.  DO NOT shower/wash with your normal soap after using and rinsing off  the CHG Soap.                9.  Pat yourself dry with a clean towel.            10.  Wear clean pajamas.            11.  Place clean sheets on your bed the night of your first shower and do not  sleep with pets. Day of Surgery : Do not apply any lotions/deodorants the morning of surgery.  Please wear clean clothes to the hospital/surgery center.  FAILURE TO FOLLOW THESE INSTRUCTIONS MAY RESULT IN THE CANCELLATION OF YOUR SURGERY PATIENT SIGNATURE_________________________________  NURSE SIGNATURE__________________________________  ________________________________________________________________________

## 2017-07-28 NOTE — Progress Notes (Signed)
   07/28/17 0933  OBSTRUCTIVE SLEEP APNEA  Have you ever been diagnosed with sleep apnea through a sleep study? No  Do you snore loudly (loud enough to be heard through closed doors)?  1  Do you often feel tired, fatigued, or sleepy during the daytime (such as falling asleep during driving or talking to someone)? 0  Has anyone observed you stop breathing during your sleep? 1  Do you have, or are you being treated for high blood pressure? 0  BMI more than 35 kg/m2? 0  Age > 50 (1-yes) 1  Neck circumference greater than:Male 16 inches or larger, Male 17inches or larger? 1  Male Gender (Yes=1) 1  Obstructive Sleep Apnea Score 5

## 2017-07-30 NOTE — Discharge Instructions (Signed)

## 2017-07-30 NOTE — H&P (Signed)
HPI: [name] is a 51 year-old male with a retained last ureteral stent and left renal calculus.  The hydronephrosis is on the left side. He does have a stent in place. His stent was placed 11/29/2014 . The stent was placed for a ureteral stone. Patient denies kidney stone, ureteral stricture, compression from mass, blood clot, and ureteral mass.   He had the following imaging studies done: CT Scan.   He is currently having back pain. He denies having flank pain, groin pain, nausea, vomiting, fever, and chills.   06/29/17:2 years ago he had a stent placed for a left UPJ stone and then went to jail and did not have his stone treated or stent removed. He presented to the emergency room on 06/27/17 with low back pain and a CT scan was obtained which revealed his stent remained in place but did not appear to be encrusted and his stone was unchanged at the UPJ and measured 19 mm long with Hounsfield units of 1400.  His urine in the emergency room was leukocyte esterase positive and nitrite positive but had only a few bacteria. He was administered Rocephin and started on cephalexin.  He has continued to have some discomfort especially in the bladder.     ALLERGIES: None   MEDICATIONS: None   GU PSH: None   NON-GU PSH: Hernia Repair    GU PMH: As above  NON-GU PMH: Gout    FAMILY HISTORY: 3 daughters - Runs in Family Aneurysm - Father   SOCIAL HISTORY: Marital Status: Single Preferred Language: English; Ethnicity: Not Hispanic Or Latino; Race: White Current Smoking Status: Patient smokes occasionally.   Tobacco Use Assessment Completed: Used Tobacco in last 30 days? Does not drink anymore.  Drinks 1 caffeinated drink per day.    REVIEW OF SYSTEMS:    GU Review Male:   Patient reports burning/ pain with urination and get up at night to urinate. Patient denies frequent urination, hard to postpone urination, leakage of urine, stream starts and stops, trouble starting your stream, have  to strain to urinate , erection problems, and penile pain.  Gastrointestinal (Upper):   Patient reports nausea and vomiting. Patient denies indigestion/ heartburn.  Gastrointestinal (Lower):   Patient denies diarrhea and constipation.  Constitutional:   Patient reports fever and night sweats. Patient denies weight loss and fatigue.  Skin:   Patient reports skin rash/ lesion and itching.   Eyes:   Patient denies blurred vision and double vision.  Ears/ Nose/ Throat:   Patient denies sore throat and sinus problems.  Hematologic/Lymphatic:   Patient denies swollen glands and easy bruising.  Cardiovascular:   Patient denies leg swelling and chest pains.  Respiratory:   Patient denies cough and shortness of breath.  Endocrine:   Patient denies excessive thirst.  Musculoskeletal:   Patient reports back pain and joint pain.   Neurological:   Patient denies headaches and dizziness.  Psychologic:   Patient reports depression. Patient denies anxiety.   VITAL SIGNS:    Weight 225 lb / 102.06 kg  Height 71 in / 180.34 cm  BP 103/63 mmHg  Pulse 62 /min  BMI 31.4 kg/m   MULTI-SYSTEM PHYSICAL EXAMINATION:    Constitutional: Well-nourished. No physical deformities. Normally developed. Good grooming.   Neck: Neck symmetrical, not swollen. Normal tracheal position.  Respiratory: No labored breathing, no use of accessory muscles.   Cardiovascular: Normal temperature, normal extremity pulses, no swelling, no varicosities.  Lymphatic: No enlargement of neck, axillae, groin.  Skin: No paleness, no jaundice, no cyanosis. No lesion, no ulcer, no rash.  Neurologic / Psychiatric: Oriented to time, oriented to place, oriented to person. No depression, no anxiety, no agitation.  Gastrointestinal: No mass, no tenderness, no rigidity, non obese abdomen.  Eyes: Normal conjunctivae. Normal eyelids.  Ears, Nose, Mouth, and Throat: Left ear no scars, no lesions, no masses. Right ear no scars, no lesions, no masses.  Nose no scars, no lesions, no masses. Normal hearing. Normal lips.  Musculoskeletal: Normal gait and station of head and neck.     PAST DATA REVIEWED:  Source Of History:  Patient, Outside Source  Lab Test Review:   CBC with Diff  Records Review:   Previous Hospital Records, POC Tool  Urine Test Review:   Urinalysis  X-Ray Review: C.T. Stone Protocol: Reviewed Films. Reviewed Report. Discussed With Patient. CT scan on 06/27/17 revealed the 1.7 cm stone at the left UPJ has Hounsfield units of 1400. There were no other right or left renal calculi.   Notes:                     His white blood cell count on 06/27/17 was 13.9.   PROCEDURES:          Urinalysis w/Scope Dipstick Dipstick Cont'd Micro  Color: Amber Bilirubin: Neg WBC/hpf: 20 - 40/hpf  Appearance: Turbid Ketones: Neg RBC/hpf: 20 - 40/hpf  Specific Gravity: 1.025 Blood: 3+ Bacteria: Few (10-25/hpf)  pH: 6.5 Protein: 2+ Cystals: NS (Not Seen)  Glucose: Neg Urobilinogen: 0.2 Casts: NS (Not Seen)    Nitrites: Positive Trichomonas: Not Present    Leukocyte Esterase: 3+ Mucous: Not Present      Epithelial Cells: 0 - 5/hpf      Yeast: NS (Not Seen)      Sperm: Not Present    Notes: UNSPUN MICROSCOPIC    ASSESSMENT/PLAN:    We discussed the management of urinary stones. These options include observation, ureteroscopy, shockwave lithotripsy, and PCNL. We discussed which options are relevant to these particular stones. We discussed the natural history of stones as well as the complications of untreated stones and the impact on quality of life without treatment as well as with each of the above listed treatments. We also discussed the efficacy of each treatment in its ability to clear the stone burden. With any of these management options I discussed the signs and symptoms of infection and the need for emergent treatment should these be experienced. For each option we discussed the ability of each procedure to clear the patient of their  stone burden.   For observation I described the risks which include but are not limited to silent renal damage, life-threatening infection, need for emergent surgery, failure to pass stone, and pain.   For ureteroscopy I described the risks which include heart attack, stroke, pulmonary embolus, death, bleeding, infection, damage to contiguous structures, positioning injury, ureteral stricture, ureteral avulsion, ureteral injury, need for ureteral stent, inability to perform ureteroscopy, need for an interval procedure, inability to clear stone burden, stent discomfort and pain.   For shockwave lithotripsy I described the risks which include arrhythmia, kidney contusion, kidney hemorrhage, need for transfusion, long-term risk of diabetes or hypertension, back discomfort, flank ecchymosis, flank abrasion, inability to break up stone, inability to pass stone fragments, Steinstrasse, infection associated with obstructing stones, need for different surgical procedure and possible need for repeat shockwave lithotripsy.   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  I went over the procedure with him in detail and the fact that I may need to leave a stent next to the current stent he has if I cannot get it out at the time of surgery requiring a second procedure for its removal and also possibly a second procedure for removal of any residual fragments.

## 2017-07-31 ENCOUNTER — Encounter (HOSPITAL_COMMUNITY): Payer: Self-pay | Admitting: Certified Registered Nurse Anesthetist

## 2017-07-31 ENCOUNTER — Encounter (HOSPITAL_COMMUNITY): Payer: Self-pay | Admitting: Emergency Medicine

## 2017-07-31 ENCOUNTER — Ambulatory Visit (HOSPITAL_COMMUNITY)
Admission: RE | Admit: 2017-07-31 | Discharge: 2017-07-31 | Disposition: A | Discharge status: Home / Self Care | Payer: Self-pay | Source: Ambulatory Visit | Attending: Urology | Admitting: Urology

## 2017-07-31 ENCOUNTER — Encounter (HOSPITAL_COMMUNITY)
Admission: RE | Disposition: A | Discharge status: Home / Self Care | Payer: Self-pay | Source: Ambulatory Visit | Attending: Urology

## 2017-07-31 ENCOUNTER — Encounter (HOSPITAL_COMMUNITY): Payer: Self-pay

## 2017-07-31 ENCOUNTER — Ambulatory Visit (HOSPITAL_COMMUNITY): Payer: Self-pay | Admitting: Anesthesiology

## 2017-07-31 ENCOUNTER — Ambulatory Visit (HOSPITAL_COMMUNITY): Payer: Self-pay

## 2017-07-31 ENCOUNTER — Ambulatory Visit (HOSPITAL_COMMUNITY)
Admission: RE | Admit: 2017-07-31 | Discharge: 2017-08-01 | Disposition: A | Discharge status: Home / Self Care | Payer: Self-pay | Source: Ambulatory Visit | Attending: Urology | Admitting: Urology

## 2017-07-31 DIAGNOSIS — M109 Gout, unspecified: Secondary | ICD-10-CM | POA: Insufficient documentation

## 2017-07-31 DIAGNOSIS — N200 Calculus of kidney: Secondary | ICD-10-CM

## 2017-07-31 DIAGNOSIS — Z87442 Personal history of urinary calculi: Secondary | ICD-10-CM

## 2017-07-31 DIAGNOSIS — N132 Hydronephrosis with renal and ureteral calculous obstruction: Secondary | ICD-10-CM | POA: Insufficient documentation

## 2017-07-31 DIAGNOSIS — F17210 Nicotine dependence, cigarettes, uncomplicated: Secondary | ICD-10-CM | POA: Insufficient documentation

## 2017-07-31 HISTORY — PX: IR URETERAL STENT LEFT NEW ACCESS W/O SEP NEPHROSTOMY CATH: IMG6075

## 2017-07-31 HISTORY — PX: CYSTOSCOPY WITH HOLMIUM LASER LITHOTRIPSY: SHX6639

## 2017-07-31 HISTORY — PX: NEPHROLITHOTOMY: SHX5134

## 2017-07-31 LAB — CBC
HEMATOCRIT: 37.8 % — AB (ref 39.0–52.0)
HEMOGLOBIN: 13.1 g/dL (ref 13.0–17.0)
MCH: 31.3 pg (ref 26.0–34.0)
MCHC: 34.7 g/dL (ref 30.0–36.0)
MCV: 90.4 fL (ref 78.0–100.0)
Platelets: 313 10*3/uL (ref 150–400)
RBC: 4.18 MIL/uL — AB (ref 4.22–5.81)
RDW: 13.4 % (ref 11.5–15.5)
WBC: 9.5 10*3/uL (ref 4.0–10.5)

## 2017-07-31 LAB — RAPID URINE DRUG SCREEN, HOSP PERFORMED
Amphetamines: NOT DETECTED
Barbiturates: NOT DETECTED
Benzodiazepines: NOT DETECTED
Cocaine: POSITIVE — AB
Opiates: POSITIVE — AB
Tetrahydrocannabinol: NOT DETECTED

## 2017-07-31 LAB — BASIC METABOLIC PANEL
ANION GAP: 11 (ref 5–15)
Anion gap: 9 (ref 5–15)
BUN: 15 mg/dL (ref 6–20)
BUN: 13 mg/dL (ref 6–20)
CALCIUM: 8.7 mg/dL — AB (ref 8.9–10.3)
CO2: 26 mmol/L (ref 22–32)
CO2: 28 mmol/L (ref 22–32)
Calcium: 8.7 mg/dL — ABNORMAL LOW (ref 8.9–10.3)
Chloride: 95 mmol/L — ABNORMAL LOW (ref 101–111)
Chloride: 96 mmol/L — ABNORMAL LOW (ref 101–111)
Creatinine, Ser: 1.05 mg/dL (ref 0.61–1.24)
Creatinine, Ser: 1.16 mg/dL (ref 0.61–1.24)
GFR calc Af Amer: >60 mL/min (ref >60)
GFR calc Af Amer: >60 mL/min (ref >60)
GFR calc non Af Amer: >60 mL/min (ref >60)
GLUCOSE: 101 mg/dL — AB (ref 65–99)
Glucose, Bld: 96 mg/dL (ref 65–99)
POTASSIUM: 4.1 mmol/L (ref 3.5–5.1)
Potassium: 4.6 mmol/L (ref 3.5–5.1)
SODIUM: 132 mmol/L — AB (ref 135–145)
Sodium: 133 mmol/L — ABNORMAL LOW (ref 135–145)

## 2017-07-31 LAB — HEMOGLOBIN AND HEMATOCRIT, BLOOD
HCT: 39.7 % (ref 39.0–52.0)
Hemoglobin: 13.3 g/dL (ref 13.0–17.0)

## 2017-07-31 LAB — PROTIME-INR
INR: 0.97
PROTHROMBIN TIME: 12.8 s (ref 11.4–15.2)

## 2017-07-31 LAB — APTT: APTT: 29 s (ref 24–36)

## 2017-07-31 SURGERY — NEPHROLITHOTOMY PERCUTANEOUS
Anesthesia: General | Site: Bladder | Laterality: Left

## 2017-07-31 MED ORDER — FENTANYL CITRATE (PF) 100 MCG/2ML IJ SOLN
INTRAMUSCULAR | Status: AC
  Filled 2017-07-31: qty 6

## 2017-07-31 MED ORDER — HYDROMORPHONE HCL-NACL 0.5-0.9 MG/ML-% IV SOSY
0.5000 mg | PREFILLED_SYRINGE | INTRAVENOUS | Status: DC | PRN
  Administered 2017-07-31 (×2): 0.5 mg via INTRAVENOUS

## 2017-07-31 MED ORDER — INFLUENZA VAC SPLIT QUAD 0.5 ML IM SUSY: 0.5000 mL | PREFILLED_SYRINGE | INTRAMUSCULAR | Status: DC

## 2017-07-31 MED ORDER — MIDAZOLAM HCL 2 MG/2ML IJ SOLN
INTRAMUSCULAR | Status: AC
  Filled 2017-07-31: qty 2

## 2017-07-31 MED ORDER — OXYCODONE HCL 5 MG PO TABS
5.0000 mg | ORAL_TABLET | Freq: Once | ORAL | Status: DC | PRN
Start: 2017-07-31 — End: 2017-07-31

## 2017-07-31 MED ORDER — HYDROMORPHONE HCL-NACL 0.5-0.9 MG/ML-% IV SOSY
0.5000 mg | PREFILLED_SYRINGE | INTRAVENOUS | Status: DC | PRN
  Administered 2017-07-31 – 2017-08-01 (×3): 1 mg via INTRAVENOUS
  Administered 2017-08-01: 0.5 mg via INTRAVENOUS
  Administered 2017-08-01: 1 mg via INTRAVENOUS
  Filled 2017-07-31: qty 2
  Filled 2017-07-31: qty 1
  Filled 2017-07-31 (×4): qty 2

## 2017-07-31 MED ORDER — HYDROMORPHONE HCL-NACL 0.5-0.9 MG/ML-% IV SOSY
0.2500 mg | PREFILLED_SYRINGE | INTRAVENOUS | Status: DC | PRN
  Administered 2017-07-31 (×4): 0.5 mg via INTRAVENOUS

## 2017-07-31 MED ORDER — ONDANSETRON HCL 4 MG/2ML IJ SOLN: 4.0000 mg | INTRAMUSCULAR | Status: DC | PRN

## 2017-07-31 MED ORDER — LIDOCAINE 2% (20 MG/ML) 5 ML SYRINGE
INTRAMUSCULAR | Status: DC | PRN
  Administered 2017-07-31: 80 mg via INTRAVENOUS

## 2017-07-31 MED ORDER — ONDANSETRON HCL 4 MG/2ML IJ SOLN
INTRAMUSCULAR | Status: AC
  Filled 2017-07-31: qty 2

## 2017-07-31 MED ORDER — CEFAZOLIN SODIUM-DEXTROSE 2-4 GM/100ML-% IV SOLN
2.0000 g | Freq: Once | INTRAVENOUS | Status: AC
  Administered 2017-07-31: 2 g via INTRAVENOUS
  Filled 2017-07-31: qty 100

## 2017-07-31 MED ORDER — SENNOSIDES-DOCUSATE SODIUM 8.6-50 MG PO TABS
2.0000 | ORAL_TABLET | Freq: Every day | ORAL | Status: DC
  Administered 2017-07-31: 2 via ORAL
  Filled 2017-07-31: qty 2

## 2017-07-31 MED ORDER — DEXAMETHASONE SODIUM PHOSPHATE 10 MG/ML IJ SOLN
INTRAMUSCULAR | Status: AC
  Filled 2017-07-31: qty 2

## 2017-07-31 MED ORDER — SODIUM CHLORIDE 0.9 % IR SOLN
Status: DC | PRN
  Administered 2017-07-31: 1000 mL
  Administered 2017-07-31 (×3): 3000 mL

## 2017-07-31 MED ORDER — FENTANYL CITRATE (PF) 100 MCG/2ML IJ SOLN
INTRAMUSCULAR | Status: AC
  Filled 2017-07-31: qty 4

## 2017-07-31 MED ORDER — MIDAZOLAM HCL 2 MG/2ML IJ SOLN
INTRAMUSCULAR | Status: AC | PRN
  Administered 2017-07-31 (×2): 1 mg via INTRAVENOUS

## 2017-07-31 MED ORDER — ACETAMINOPHEN 500 MG PO TABS
1000.0000 mg | ORAL_TABLET | Freq: Four times a day (QID) | ORAL | Status: AC
  Administered 2017-07-31 – 2017-08-01 (×4): 1000 mg via ORAL
  Filled 2017-07-31 (×4): qty 2

## 2017-07-31 MED ORDER — IOPAMIDOL (ISOVUE-300) INJECTION 61%: 50.0000 mL | Freq: Once | INTRAVENOUS | Status: DC | PRN

## 2017-07-31 MED ORDER — ONDANSETRON HCL 4 MG/2ML IJ SOLN
INTRAMUSCULAR | Status: DC | PRN
  Administered 2017-07-31: 4 mg via INTRAVENOUS

## 2017-07-31 MED ORDER — HYDROMORPHONE HCL 2 MG PO TABS
2.0000 mg | ORAL_TABLET | ORAL | Status: DC | PRN
  Administered 2017-08-01 (×2): 2 mg via ORAL
  Filled 2017-07-31 (×2): qty 1

## 2017-07-31 MED ORDER — LIDOCAINE HCL (PF) 1 % IJ SOLN
INTRAMUSCULAR | Status: AC | PRN
  Administered 2017-07-31: 10 mL via INTRADERMAL

## 2017-07-31 MED ORDER — KETOROLAC TROMETHAMINE 30 MG/ML IJ SOLN
30.0000 mg | Freq: Four times a day (QID) | INTRAMUSCULAR | Status: DC
  Administered 2017-07-31 – 2017-08-01 (×3): 30 mg via INTRAVENOUS
  Filled 2017-07-31 (×4): qty 1

## 2017-07-31 MED ORDER — SODIUM CHLORIDE 0.9 % IV SOLN: INTRAVENOUS | Status: DC

## 2017-07-31 MED ORDER — PROPOFOL 10 MG/ML IV BOLUS
INTRAVENOUS | Status: AC
  Filled 2017-07-31: qty 20

## 2017-07-31 MED ORDER — SUGAMMADEX SODIUM 200 MG/2ML IV SOLN
INTRAVENOUS | Status: DC | PRN
Start: 2017-07-31 — End: 2017-07-31
  Administered 2017-07-31: 200 mg via INTRAVENOUS

## 2017-07-31 MED ORDER — SUGAMMADEX SODIUM 200 MG/2ML IV SOLN
INTRAVENOUS | Status: AC
  Filled 2017-07-31: qty 2

## 2017-07-31 MED ORDER — IOPAMIDOL (ISOVUE-300) INJECTION 61%
INTRAVENOUS | Status: AC
  Administered 2017-07-31: 5 mL
  Filled 2017-07-31: qty 50

## 2017-07-31 MED ORDER — PROPOFOL 10 MG/ML IV BOLUS
INTRAVENOUS | Status: DC | PRN
Start: 2017-07-31 — End: 2017-07-31
  Administered 2017-07-31: 200 mg via INTRAVENOUS

## 2017-07-31 MED ORDER — LIDOCAINE 2% (20 MG/ML) 5 ML SYRINGE
INTRAMUSCULAR | Status: AC
  Filled 2017-07-31: qty 5

## 2017-07-31 MED ORDER — OXYCODONE HCL 5 MG/5ML PO SOLN
5.0000 mg | Freq: Once | ORAL | Status: DC | PRN
  Filled 2017-07-31: qty 5

## 2017-07-31 MED ORDER — SODIUM CHLORIDE 0.9% FLUSH: 3.0000 mL | INTRAVENOUS | Status: DC | PRN

## 2017-07-31 MED ORDER — LACTATED RINGERS IV SOLN
INTRAVENOUS | Status: DC
  Administered 2017-07-31 (×2): via INTRAVENOUS

## 2017-07-31 MED ORDER — OXYBUTYNIN CHLORIDE 5 MG PO TABS
5.0000 mg | ORAL_TABLET | Freq: Three times a day (TID) | ORAL | Status: DC | PRN
  Administered 2017-07-31 – 2017-08-01 (×2): 5 mg via ORAL
  Filled 2017-07-31 (×2): qty 1

## 2017-07-31 MED ORDER — MIDAZOLAM HCL 2 MG/2ML IJ SOLN
INTRAMUSCULAR | Status: AC
Start: 2017-07-31 — End: 2017-07-31
  Filled 2017-07-31: qty 6

## 2017-07-31 MED ORDER — HYDROMORPHONE HCL-NACL 0.5-0.9 MG/ML-% IV SOSY
PREFILLED_SYRINGE | INTRAVENOUS | Status: AC
  Administered 2017-07-31: 0.5 mg via INTRAVENOUS
  Filled 2017-07-31: qty 4

## 2017-07-31 MED ORDER — SODIUM CHLORIDE 0.9 % IV SOLN: 250.0000 mL | INTRAVENOUS | Status: DC | PRN

## 2017-07-31 MED ORDER — DEXAMETHASONE SODIUM PHOSPHATE 10 MG/ML IJ SOLN
INTRAMUSCULAR | Status: DC | PRN
  Administered 2017-07-31: 10 mg via INTRAVENOUS

## 2017-07-31 MED ORDER — CEFAZOLIN SODIUM-DEXTROSE 2-4 GM/100ML-% IV SOLN
INTRAVENOUS | Status: AC
  Filled 2017-07-31: qty 100

## 2017-07-31 MED ORDER — CIPROFLOXACIN HCL 500 MG PO TABS
500.0000 mg | ORAL_TABLET | Freq: Two times a day (BID) | ORAL | Status: DC
  Administered 2017-07-31 – 2017-08-01 (×2): 500 mg via ORAL
  Filled 2017-07-31 (×2): qty 1

## 2017-07-31 MED ORDER — ROCURONIUM BROMIDE 10 MG/ML (PF) SYRINGE
PREFILLED_SYRINGE | INTRAVENOUS | Status: DC | PRN
  Administered 2017-07-31: 50 mg via INTRAVENOUS

## 2017-07-31 MED ORDER — LIDOCAINE HCL (PF) 1 % IJ SOLN
INTRAMUSCULAR | Status: AC
  Filled 2017-07-31: qty 30

## 2017-07-31 MED ORDER — ONDANSETRON HCL 4 MG/2ML IJ SOLN: 4.0000 mg | Freq: Four times a day (QID) | INTRAMUSCULAR | Status: DC | PRN

## 2017-07-31 MED ORDER — FENTANYL CITRATE (PF) 100 MCG/2ML IJ SOLN: 25.0000 ug | INTRAMUSCULAR | Status: DC | PRN

## 2017-07-31 MED ORDER — FENTANYL CITRATE (PF) 100 MCG/2ML IJ SOLN
INTRAMUSCULAR | Status: AC | PRN
  Administered 2017-07-31 (×2): 50 ug via INTRAVENOUS

## 2017-07-31 MED ORDER — DEXTROSE-NACL 5-0.9 % IV SOLN
INTRAVENOUS | Status: DC
  Administered 2017-07-31 – 2017-08-01 (×3): via INTRAVENOUS

## 2017-07-31 MED ORDER — CIPROFLOXACIN IN D5W 400 MG/200ML IV SOLN
400.0000 mg | Freq: Once | INTRAVENOUS | Status: AC
  Administered 2017-07-31: 400 mg via INTRAVENOUS
  Filled 2017-07-31: qty 200

## 2017-07-31 MED ORDER — FENTANYL CITRATE (PF) 250 MCG/5ML IJ SOLN
INTRAMUSCULAR | Status: AC
  Filled 2017-07-31: qty 5

## 2017-07-31 MED ORDER — SODIUM CHLORIDE 0.9% FLUSH: 3.0000 mL | Freq: Two times a day (BID) | INTRAVENOUS | Status: DC

## 2017-07-31 SURGICAL SUPPLY — 46 items
AGENT HMST KT MTR STRL THRMB (HEMOSTASIS)
APPLICATOR SURGIFLO ENDO (HEMOSTASIS) IMPLANT
BAG URINE DRAINAGE (UROLOGICAL SUPPLIES) IMPLANT
BASKET ZERO TIP NITINOL 2.4FR (BASKET) IMPLANT
BENZOIN TINCTURE PRP APPL 2/3 (GAUZE/BANDAGES/DRESSINGS) ×3 IMPLANT
BLADE SURG 15 STRL LF DISP TIS (BLADE) ×2 IMPLANT
BLADE SURG 15 STRL SS (BLADE) ×2
CATH FOLEY 2W COUNCIL 20FR 5CC (CATHETERS) IMPLANT
CATH FOLEY 2WAY SLVR  5CC 18FR (CATHETERS)
CATH FOLEY 2WAY SLVR 5CC 18FR (CATHETERS) IMPLANT
CATH IMAGER II 65CM (CATHETERS) IMPLANT
CATH X-FORCE N30 NEPHROSTOMY (TUBING) ×3 IMPLANT
COVER SURGICAL LIGHT HANDLE (MISCELLANEOUS) ×3 IMPLANT
DERMABOND ADVANCED (GAUZE/BANDAGES/DRESSINGS) ×1
DERMABOND ADVANCED .7 DNX12 (GAUZE/BANDAGES/DRESSINGS) ×2 IMPLANT
DRAPE C-ARM 42X120 X-RAY (DRAPES) ×3 IMPLANT
DRAPE LINGEMAN PERC (DRAPES) ×3 IMPLANT
DRAPE SURG IRRIG POUCH 19X23 (DRAPES) ×3 IMPLANT
DRSG PAD ABDOMINAL 8X10 ST (GAUZE/BANDAGES/DRESSINGS) ×6 IMPLANT
DRSG TEGADERM 8X12 (GAUZE/BANDAGES/DRESSINGS) IMPLANT
FIBER LASER TRAC TIP (UROLOGICAL SUPPLIES) IMPLANT
FLOSEAL 10ML (HEMOSTASIS) ×3 IMPLANT
GAUZE SPONGE 4X4 12PLY STRL (GAUZE/BANDAGES/DRESSINGS) ×3 IMPLANT
GLOVE BIOGEL M 8.0 STRL (GLOVE) ×3 IMPLANT
GOWN STRL REUS W/TWL XL LVL3 (GOWN DISPOSABLE) ×3 IMPLANT
GUIDEWIRE STR DUAL SENSOR (WIRE) IMPLANT
HOLDER FOLEY CATH W/STRAP (MISCELLANEOUS) ×3 IMPLANT
KIT BASIN OR (CUSTOM PROCEDURE TRAY) ×3 IMPLANT
MANIFOLD NEPTUNE II (INSTRUMENTS) ×3 IMPLANT
NS IRRIG 1000ML POUR BTL (IV SOLUTION) ×3 IMPLANT
PACK CYSTO (CUSTOM PROCEDURE TRAY) ×3 IMPLANT
PROBE LITHOCLAST ULTRA 3.8X403 (UROLOGICAL SUPPLIES) ×3 IMPLANT
PROBE PNEUMATIC 1.0MMX570MM (UROLOGICAL SUPPLIES) ×3 IMPLANT
SET IRRIG Y TYPE TUR BLADDER L (SET/KITS/TRAYS/PACK) ×3 IMPLANT
SHEATH PEELAWAY SET 9 (SHEATH) ×3 IMPLANT
STENT URET 6FRX24 CONTOUR (STENTS) ×3 IMPLANT
STONE CATCHER W/TUBE ADAPTER (UROLOGICAL SUPPLIES) ×3 IMPLANT
SURGIFLO W/THROMBIN 8M KIT (HEMOSTASIS) IMPLANT
SUT MNCRL AB 4-0 PS2 18 (SUTURE) ×3 IMPLANT
SUT SILK 2 0 30  PSL (SUTURE)
SUT SILK 2 0 30 PSL (SUTURE) IMPLANT
SYR 10ML LL (SYRINGE) ×3 IMPLANT
SYR 20CC LL (SYRINGE) ×6 IMPLANT
TOWEL OR NON WOVEN STRL DISP B (DISPOSABLE) ×3 IMPLANT
TRAY FOLEY W/METER SILVER 16FR (SET/KITS/TRAYS/PACK) IMPLANT
TUBING CONNECTING 10 (TUBING) ×6 IMPLANT

## 2017-07-31 NOTE — Sedation Documentation (Signed)
Patient denies pain and is resting comfortably.  

## 2017-07-31 NOTE — Transfer of Care (Signed)
Immediate Anesthesia Transfer of Care Note  Patient: [name]  Procedure(s) Performed: NEPHROLITHOTOMY PERCUTANEOUS (Left Back) LEFT URETEROSCOPY,LEFT URETERAL STENT PLACEMENT (Left Bladder)  Patient Location: PACU  Anesthesia Type:General  Level of Consciousness: awake and patient cooperative  Airway & Oxygen Therapy: Patient Spontanous Breathing and Patient connected to face mask oxygen  Post-op Assessment: Report given to RN, Post -op Vital signs reviewed and stable and Patient moving all extremities  Post vital signs: Reviewed and stable  Last Vitals:  Vitals:   07/31/17 0725 07/31/17 1052  BP: 118/74   Pulse: 72   Resp: 18   Temp: 36.8 C   SpO2: 94% 93%    Last Pain:  Vitals:   07/31/17 1052  TempSrc:   PainSc: 0-No pain      Patients Stated Pain Goal: 4 (07/31/17 0745)  Complications: No apparent anesthesia complications

## 2017-07-31 NOTE — H&P (Signed)
Chief Complaint: left nephrolithiasis  Referring Physician:Dr. Ihor Gully  Supervising Physician: Oley Balm  Patient Status: [facility] - Out-pt  HPI: [name] is a 51 y.o. male who presents today for nephroureteral access for removal of kidney stone.  He has had this for 2 years, but has recently started to give him pain.  He has been to the ED with in the last 2 weeks secondary to this pain.  He received bactrim for 2 weeks secondary to a dirty UA.  He is followed by Dr. Vernie Ammons and has plans today for access so he can then proceed to the OR for stone extraction.    This morning the patient and his girlfriend are having trouble staying awake and not dozing during conversation.  The patient states he didn't sleep well because he was nervous.  He does admit to taking a percocet last night to the RN.  He states that he snorted heroin 2 weeks ago, but states he hasn't done any recently.    Past Medical History:  Past Medical History:  Diagnosis Date  . Gout   . Kidney stones   . 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 Leticia Penna    Past Surgical History:  Past Surgical History:  Procedure Laterality Date  . HERNIA REPAIR    . urteral stent  2016    Family History: History reviewed. No pertinent family history.  Social History:  reports that he has been smoking Cigarettes.  He has been smoking about 0.50 packs per day. He has never used smokeless tobacco. He reports that he uses drugs, including IV and Heroin. He reports that he does not drink alcohol.  Allergies:  Allergies  Allergen Reactions  . Bee Venom Shortness Of Breath and Swelling    Medications: Medications reviewed in epic  Please HPI for pertinent positives, otherwise complete 10 system ROS negative.  Mallampati Score: MD Evaluation Airway: WNL Heart: WNL Abdomen: WNL Chest/ Lungs: WNL ASA  Classification: 2 Mallampati/Airway Score: One  Physical Exam: Temp (F)   98.2  98.2 (36.8)  10/08  0725  Pulse Rate   72  72  10/08 0725  Resp   18  18  10/08 0725  BP   118/74  118/74  10/08 0725  SpO2 (%)   94  94  10/08 0725  Weight (lb)   224  224 lb (101.6 kg)  10    General: drowsy, WD, WN white male who is laying in bed in NAD HEENT: head is normocephalic, atraumatic.  Sclera are noninjected.  PERRL.  Ears and nose without any masses or lesions.  Mouth is pink and moist Heart: regular, rate, and rhythm.  Normal s1,s2. No obvious murmurs, gallops, or rubs noted.  Palpable radial and pedal pulses bilaterally Lungs: CTAB, no wheezes, rhonchi, or rales noted.  Respiratory effort nonlabored Abd: soft, NT, ND, +BS, no masses, hernias, or organomegaly Skin: warm and dry with no masses, lesions, or rashes.  He does have several areas on his arms concerning for track marks, but not confirmed Psych: A&Ox3 but more drowsy during conversation than expected   Labs: Sodium 135 - 145 mmol/L 132    Potassium 3.5 - 5.1 mmol/L 4.1   Chloride 101 - 111 mmol/L 95    CO2 22 - 32 mmol/L 26   Glucose, Bld 65 - 99 mg/dL 696    BUN 6 - 20 mg/dL 15   Creatinine, Ser 2.95 - 1.24 mg/dL 2.84  Calcium 8.9 - 10.3 mg/dL 8.7    GFR calc non Af Amer >60 mL/min >60   GFR calc Af Amer >60 mL/min >60    Prothrombin Time 11.4 - 15.2 seconds 12.8   INR  0.97    WBC 4.0 - 10.5 K/uL 9.5   RBC 4.22 - 5.81 MIL/uL 4.18    Hemoglobin 13.0 - 17.0 g/dL 41.3   HCT 24.4 - 01.0 % 37.8    MCV 78.0 - 100.0 fL 90.4   MCH 26.0 - 34.0 pg 31.3   MCHC 30.0 - 36.0 g/dL 27.2   RDW 53.6 - 64.4 % 13.4   Platelets 150 - 400 K/uL 313      Imaging: No results found.  Assessment/Plan 1. Left nephrolithiasis  Plan today is for nephroureteral access by IR and then plan for OR for intervention.  His labs and vitals have been reviewed.  Given his history and his current behavior, a UDS has been ordered.  If this is positive, then we will contact anesthesia to determine plans for today.  Risks and benefits of  nephroureteral access were discussed with the patient including, but not limited to, infection, bleeding, significant bleeding causing loss or decrease in renal function or damage to adjacent structures.   All of the patient's questions were answered, patient is agreeable to proceed.  Consent signed and in chart.   Thank you for this interesting consult.  I greatly enjoyed meeting [name] and look forward to participating in their care.  A copy of this report was sent to the requesting provider on this date.  Electronically Signed: Letha Cape 07/31/2017, 8:55 AM   I spent a total of  30 Minutes   in face to face in clinical consultation, greater than 50% of which was counseling/coordinating care for left nephrolithiasis

## 2017-07-31 NOTE — Anesthesia Postprocedure Evaluation (Signed)
Anesthesia Post Note  Patient: DEAKEN JURGENS  Procedure(s) Performed: NEPHROLITHOTOMY PERCUTANEOUS (Left Back) LEFT URETEROSCOPY,LEFT URETERAL STENT PLACEMENT (Left Bladder)     Patient location during evaluation: PACU Anesthesia Type: General Level of consciousness: awake and alert Pain management: pain level controlled Vital Signs Assessment: post-procedure vital signs reviewed and stable Respiratory status: spontaneous breathing, nonlabored ventilation, respiratory function stable and patient connected to nasal cannula oxygen Cardiovascular status: blood pressure returned to baseline and stable Postop Assessment: no apparent nausea or vomiting Anesthetic complications: no    Last Vitals:  Vitals:   07/31/17 1330 07/31/17 1345  BP: 116/81 127/87  Pulse: (!) 51 62  Resp: 11 14  Temp: 36.4 C 36.5 C  SpO2: 100% 100%    Last Pain:  Vitals:   07/31/17 1345  TempSrc:   PainSc: 9                  Arnol Mcgibbon S

## 2017-07-31 NOTE — Op Note (Signed)
Preoperative Diagnosis:   Left renal stone 2.1 cm Retained ureteral stent   Postoperative Diagnosis:   Same   Procedure(s) Performed:   1. Left percutaneous nephrostolithotomy for stone burden greater than 2 cm 2. Left ureteral stent removal, complicated 3. Left ureteral stent placement 4. Antegrade ureteroscopy 5.  Fluoroscopy time 1 hour   Teaching SurgeonIhor Gullyttelin, M.D.   Resident Surgeon:  Jamse Arn McCormick,M.D.   Assistants:  None listed   Anesthesia:  General via endotracheal tube.     IV Fluids:  See Anesthesia record.   Estimated Blood Loss:  50 mL's.   Cultures:  None   Drains:   - Left 6Frx24cm JJ ureteral stent without string - 16Fr Foley catheter   Specimens: None   Complications:  None.   Indications for Surgery:  [name] is a 51 y.o. male with a history of nephrolithiasis who underwent stent placement in 2016 for a UPJ stone. He was lost to follow up and recently presented with symptoms. He underwent a CT scan which revealed a 2.1 cm L UPJ stone as well as a ureteral stent without evidence of significant encrustation.  The patient presents today for percutaneous treatment of Left kidney stone as well as removal of his retrained stent. The risks and benefits of the procedure were discussed with the patient who wishes to proceed.   Operative Findings:   Successful percutaneous treatment of the entirety of the stone burden.  Successful removal of his retained stent. New stent placed antegrade. Nephrostomy tract sealed with Floseal and closed.    Procedure:  The patient was correctly identified in the preoperative holding area where written informed consent as well as potential risks and complications were reviewed. The patient was brought back to the operative suite where a preinduction timeout was performed. Once correct information was verified, general anesthesia was induced via endotracheal tube.  The patient was then gently repositioned  into the prone position, paying careful attention to pad all pressure points and affixed the patient to the bed at multiple points of contact.  He was then prepped and draped in the usual sterile fashion and given appropriate perioperative procedural antibiotics.  Sequential compression devices were placed for VTE prophylaxis.  A second timeout was then performed.   We first placed a Super stiff wire through the percutaneous ureteral catheter placed by IR. We removed the catheter, leaving the wire in place. Using a combination of catheters and dilators, we placed a second safety wire and then developed our percutaneous tract with the advancement of a 30 French x 20 cm Nephromax balloon dilator.  After this, a 30 French sheath was advanced to the edge of the distal calyx on fluoroscopy.     We then performed rigid nephroscopy with the lithotrite. Immediately upon entrance into the collecting system, we encountered the retained stent, which was removed carefully under fluoro without complication. Next, we treated the stone in its entirety with lithotripsy. We then switched to flexible ureteroscopy and visualized the entire ureter to the bladder in an antegrade fashion.   At this point, we elected to leave a 6Frx24cm JJ ureteral stent without string, which was performed easily under fluoro and visual guidance. We the removed all wires, and sealed the nephrostomy tract with floseal. The skin was closed with 4-0 monocryl and Dermabond. The patient was carefully returned to supine position.  At this point, the patient was extubated and taken to the recovery area in stable fashion.  I was  present and assisted throughout the entire case.

## 2017-07-31 NOTE — Anesthesia Procedure Notes (Signed)
Procedure Name: Intubation Date/Time: 07/31/2017 11:15 AM Performed by: Willeen Cass P Pre-anesthesia Checklist: Patient identified, Emergency Drugs available, Suction available and Patient being monitored Patient Re-evaluated:Patient Re-evaluated prior to induction Oxygen Delivery Method: Circle System Utilized Preoxygenation: Pre-oxygenation with 100% oxygen Induction Type: IV induction Ventilation: Mask ventilation without difficulty Laryngoscope Size: Mac and 4 Grade View: Grade II Tube type: Oral Tube size: 7.5 mm Number of attempts: 1 Airway Equipment and Method: Stylet Placement Confirmation: ETT inserted through vocal cords under direct vision,  positive ETCO2 and breath sounds checked- equal and bilateral Secured at: 22 cm Tube secured with: Tape Dental Injury: Teeth and Oropharynx as per pre-operative assessment

## 2017-07-31 NOTE — Procedures (Signed)
  Procedure:   LEFT perc nephroureteral antegrade catheter 52f Preprocedure diagnosis:  Nephrolithiasis Postprocedure diagnosis:  same EBL:     minimal Complications:   none immediate  See full dictation in [employer].  Thora Lance MD Main # [phone] Pager  [phone]

## 2017-07-31 NOTE — Discharge Instructions (Signed)
Moderate Conscious Sedation, Adult, Care After  These instructions provide you with information about caring for yourself after your procedure. Your health care provider may also give you more specific instructions. Your treatment has been planned according to current medical practices, but problems sometimes occur. Call your health care provider if you have any problems or questions after your procedure.  What can I expect after the procedure?  After your procedure, it is common:   To feel sleepy for several hours.   To feel clumsy and have poor balance for several hours.   To have poor judgment for several hours.   To vomit if you eat too soon.    Follow these instructions at home:  For at least 24 hours after the procedure:     Do not:  ? Participate in activities where you could fall or become injured.  ? Drive.  ? Use heavy machinery.  ? Drink alcohol.  ? Take sleeping pills or medicines that cause drowsiness.  ? Make important decisions or sign legal documents.  ? Take care of children on your own.   Rest.  Eating and drinking   Follow the diet recommended by your health care provider.   If you vomit:  ? Drink water, juice, or soup when you can drink without vomiting.  ? Make sure you have little or no nausea before eating solid foods.  General instructions   Have a responsible adult stay with you until you are awake and alert.   Take over-the-counter and prescription medicines only as told by your health care provider.   If you smoke, do not smoke without supervision.   Keep all follow-up visits as told by your health care provider. This is important.  Contact a health care provider if:   You keep feeling nauseous or you keep vomiting.   You feel light-headed.   You develop a rash.   You have a fever.  Get help right away if:   You have trouble breathing.  This information is not intended to replace advice given to you by your health care provider. Make sure you discuss any questions you have  with your health care provider.  Document Released: 07/31/2013 Document Revised: 03/14/2016 Document Reviewed: 01/30/2016  Elsevier Interactive Patient Education  2018 [employer]

## 2017-07-31 NOTE — Progress Notes (Signed)
Left nephrostomy insertion site covered with gauze dressing taped securely.  Dressing dry/intact without drainage noted.  Tubing clamped and secured beneath dressing.

## 2017-07-31 NOTE — Anesthesia Preprocedure Evaluation (Addendum)
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,   Endo/Other    Renal/GU stones     Musculoskeletal   Abdominal   Peds  Hematology   Anesthesia Other Findings   Reproductive/Obstetrics                            Anesthesia Physical Anesthesia Plan  ASA: II  Anesthesia Plan: General   Post-op Pain Management:    Induction: Intravenous  PONV Risk Score and Plan: 1 and Ondansetron, Dexamethasone and Midazolam  Airway Management Planned: Oral ETT  Additional Equipment:   Intra-op Plan:   Post-operative Plan: Extubation in OR  Informed Consent: I have reviewed the patients History and Physical, chart, labs and discussed the procedure including the risks, benefits and alternatives for the proposed anesthesia with the patient or authorized representative who has indicated his/her understanding and acceptance.     Plan Discussed with: CRNA, Anesthesiologist and Surgeon  Anesthesia Plan Comments:         Anesthesia Quick Evaluation

## 2017-08-01 LAB — MRSA PCR SCREENING: MRSA BY PCR: POSITIVE — AB

## 2017-08-01 LAB — BASIC METABOLIC PANEL
Anion gap: 9 (ref 5–15)
BUN: 17 mg/dL (ref 6–20)
CO2: 22 mmol/L (ref 22–32)
Calcium: 8.4 mg/dL — ABNORMAL LOW (ref 8.9–10.3)
Chloride: 105 mmol/L (ref 101–111)
Creatinine, Ser: 1.09 mg/dL (ref 0.61–1.24)
GFR calc Af Amer: >60 mL/min (ref >60)
GFR calc non Af Amer: >60 mL/min (ref >60)
Glucose, Bld: 163 mg/dL — ABNORMAL HIGH (ref 65–99)
Potassium: 5.2 mmol/L — ABNORMAL HIGH (ref 3.5–5.1)
Sodium: 136 mmol/L (ref 135–145)

## 2017-08-01 LAB — HEMOGLOBIN AND HEMATOCRIT, BLOOD
HCT: 37.7 % — ABNORMAL LOW (ref 39.0–52.0)
Hemoglobin: 12.9 g/dL — ABNORMAL LOW (ref 13.0–17.0)

## 2017-08-01 MED ORDER — TAMSULOSIN HCL 0.4 MG PO CAPS: 0.4000 mg | ORAL_CAPSULE | Freq: Every day | ORAL | 0 refills | Status: AC

## 2017-08-01 MED ORDER — HYDROMORPHONE HCL 4 MG PO TABS: 4.0000 mg | ORAL_TABLET | ORAL | 0 refills | Status: DC | PRN

## 2017-08-01 MED ORDER — OXYBUTYNIN CHLORIDE 5 MG PO TABS: 5.0000 mg | ORAL_TABLET | Freq: Three times a day (TID) | ORAL | 3 refills | Status: DC | PRN

## 2017-08-01 MED ORDER — MUPIROCIN 2 % EX OINT
1.0000 "application " | TOPICAL_OINTMENT | Freq: Two times a day (BID) | CUTANEOUS | Status: DC
  Filled 2017-08-01: qty 22

## 2017-08-01 MED ORDER — SENNOSIDES-DOCUSATE SODIUM 8.6-50 MG PO TABS: 2.0000 | ORAL_TABLET | Freq: Every day | ORAL | 1 refills | Status: AC

## 2017-08-01 NOTE — Progress Notes (Addendum)
MD office notified of +MRSA in urine. [name], [name]   Pt on CX-specific Cipro which covers this. No furhter intervention warranted.

## 2017-08-01 NOTE — Discharge Summary (Signed)
[facility] Discharge Summary  Admit date: 07/31/2017  Discharge date and time: 08/01/17   Discharge to: Home  Discharge Service: Urology  Discharge Attending Physician:  Dr. Vernie Ammons  Discharge  Diagnoses: Nephrolithiasis  Secondary Diagnosis: Active Problems:   Nephrolith   OR Procedures: Procedure(s): NEPHROLITHOTOMY PERCUTANEOUS LEFT URETEROSCOPY,LEFT URETERAL STENT PLACEMENT 07/31/2017   Ancillary Procedures: None   Discharge Day Services: The patient was seen and examined by the Urology team both in the morning and immediately prior to discharge.  Vital signs and laboratory values were stable and within normal limits.  The physical exam was benign and unchanged and all surgical wounds were examined.  Discharge instructions were explained and all questions answered.  Subjective  No acute events overnight. Pain Controlled. No fever or chills.  Objective Patient Vitals for the past 8 hrs:  BP Temp Temp src Pulse Resp SpO2  08/01/17 0940 125/80 97.6 F (36.4 C) Oral 63 16 96 %  08/01/17 0515 132/72 98.4 F (36.9 C) Oral - 16 98 %   Total I/O In: 360 [P.O.:360] Out: 225 [Urine:225]  General Appearance:        No acute distress Lungs:                       Normal work of breathing on room air Heart:                                Regular rate and rhythm Abdomen:                         Soft, non-tender, non-distended Back:        Incision c/d/i Extremities:                      Warm and well perfused   Hospital Course:  The patient underwent a left PCNL on 07/31/2017.  The patient tolerated the procedure well, was extubated in the OR, and afterwards was taken to the PACU for routine post-surgical care. When stable the patient was transferred to the floor.   The patient did well postoperatively.  The patient's diet was slowly advanced and at the time of discharge was tolerating a regular diet.  The patient was discharged home 1 Day Post-Op, at which point was  tolerating a regular solid diet, was able to void spontaneously, have adequate pain control with P.O. pain medication, and could ambulate without difficulty. The patient will follow up with [place] for post op check and stent removal in 1-2 weeks. Patient will finish his courses of Cipro and Keflex.   Condition at Discharge: Improved  Discharge Medications:  Allergies as of 08/01/2017      Reactions   Bee Venom Shortness Of Breath, Swelling      Medication List    TAKE these medications   cephALEXin 500 MG capsule Commonly known as:  KEFLEX 2 caps po bid x 7 days   ciprofloxacin 500 MG tablet Commonly known as:  CIPRO Take 1 tablet (500 mg total) by mouth 2 (two) times daily. One po bid x 7 days   HYDROmorphone 4 MG tablet Commonly known as:  DILAUDID Take 1 tablet (4 mg total) by mouth every 4 (four) hours as needed for severe pain.   indomethacin 50 MG capsule Commonly known as:  INDOCIN Take 50 mg by mouth 3 (three) times daily as needed.  ketorolac 10 MG tablet Commonly known as:  TORADOL Take 1 tablet (10 mg total) by mouth every 6 (six) hours as needed. Take with food   ondansetron 4 MG disintegrating tablet Commonly known as:  ZOFRAN ODT  ODT q4 hours prn nausea/vomit   oxybutynin 5 MG tablet Commonly known as:  DITROPAN Take 1 tablet (5 mg total) by mouth every 8 (eight) hours as needed for bladder spasms.   oxyCODONE-acetaminophen 5-325 MG tablet Commonly known as:  PERCOCET Take 1-2 tablets by mouth every 6 (six) hours as needed.   oxyCODONE-acetaminophen 5-325 mg Tabs tablet Commonly known as:  PERCOCET Take 6 tablets by mouth every 4 (four) hours as needed.   senna-docusate 8.6-50 MG tablet Commonly known as:  Senokot-S Take 2 tablets by mouth at bedtime.   sulfamethoxazole-trimethoprim 800-160 MG tablet Commonly known as:  BACTRIM DS,SEPTRA DS Take 1 tablet by mouth 2 (two) times daily.   tamsulosin 0.4 MG Caps capsule Commonly known as:   FLOMAX Take 1 capsule (0.4 mg total) by mouth at bedtime.       Patient was seen, examined, discharge plan was discussed with the resident.  I have directly reviewed the clinical findings, lab, imaging studies and management of this patient in detail. I have made the necessary changes and/or additions to the above noted documentation, and agree with the documentation, as recorded by the resident.

## 2017-08-03 ENCOUNTER — Encounter (HOSPITAL_COMMUNITY): Payer: Self-pay | Admitting: Urology

## 2017-10-30 ENCOUNTER — Emergency Department (HOSPITAL_COMMUNITY)
Admission: EM | Admit: 2017-10-30 | Discharge: 2017-10-31 | Disposition: A | Discharge status: Home / Self Care | Payer: Self-pay | Attending: Emergency Medicine | Admitting: Emergency Medicine

## 2017-10-30 ENCOUNTER — Other Ambulatory Visit: Payer: Self-pay

## 2017-10-30 DIAGNOSIS — M109 Gout, unspecified: Secondary | ICD-10-CM | POA: Insufficient documentation

## 2017-10-30 DIAGNOSIS — L03115 Cellulitis of right lower limb: Secondary | ICD-10-CM | POA: Insufficient documentation

## 2017-10-30 DIAGNOSIS — F17210 Nicotine dependence, cigarettes, uncomplicated: Secondary | ICD-10-CM | POA: Insufficient documentation

## 2017-10-30 MED ORDER — HYDROMORPHONE HCL 1 MG/ML IJ SOLN
1.0000 mg | Freq: Once | INTRAMUSCULAR | Status: DC
  Filled 2017-10-30: qty 1

## 2017-10-30 NOTE — ED Triage Notes (Signed)
Presents with red, warm, swollen bnilateral ankles and toes, HX of gouty arthritis. BIlateral lower leg swelling noted as well. REports severe pain. Denies fevers. Began 3-4 days ago

## 2017-10-30 NOTE — ED Provider Notes (Signed)
[facility] EMERGENCY DEPARTMENT Provider Note   CSN: [id number] Arrival date & time: 10/30/17  2047     History   Chief Complaint Chief Complaint  Patient presents with  . Leg Swelling    HPI [name] is a 52 y.o. male a history of gout and nephrolithiasis who presents to the emergency department with a chief complaint of constant, worsening redness, pain, and swelling to the bilateral lower legs, ankles, and feet.  He characterizes the pain as sharp and throbbing. He reports associated chills. Pain is worsen with direct palpation and weight bearing. Improved by nothing.  He is treated his symptoms at home with Tylenol and ibuprofen with no improvement.  He was previously on indomethacin for gout, but has run out of the medication since he lost his insurance coverage.  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  He reports most recently that he has been injecting into his left hand.  He denies injecting into the lower extremities. No history of  DM.  He denies chest pain, dyspnea, or back pain.    The history is provided by the patient. No language interpreter was used.    Past Medical History:  Diagnosis Date  . Gout   . Kidney stones   . 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 Leticia Penna    Patient Active Problem List   Diagnosis Date Noted  . Nephrolith 07/31/2017    Past Surgical History:  Procedure Laterality Date  . CYSTOSCOPY WITH HOLMIUM LASER LITHOTRIPSY Left 07/31/2017   Procedure: LEFT URETEROSCOPY,LEFT URETERAL STENT PLACEMENT;  Surgeon: Ihor Gully, MD;  Location: [facility];  Service: Urology;  Laterality: Left;  . HERNIA REPAIR    . IR URETERAL STENT LEFT NEW ACCESS W/O SEP NEPHROSTOMY CATH  07/31/2017  . NEPHROLITHOTOMY Left 07/31/2017   Procedure: NEPHROLITHOTOMY PERCUTANEOUS;  Surgeon: Ihor Gully, MD;  Location: [facility];  Service: Urology;  Laterality: Left;  . urteral stent  2016       Home Medications    Prior to Admission medications    Medication Sig Start Date End Date Taking? Authorizing Provider  senna-docusate (SENOKOT-S) 8.6-50 MG tablet Take 2 tablets by mouth at bedtime. Patient taking differently: Take 2 tablets by mouth daily as needed for mild constipation or moderate constipation.  08/01/17  Yes McCormickJamse Arn, MD  cephALEXin (KEFLEX) 500 MG capsule Take 1 capsule (500 mg total) by mouth 4 (four) times daily for 7 days. 10/31/17 11/07/17  Kimbree Casanas A, PA-C  indomethacin (INDOCIN) 50 MG capsule Take 1 capsule (50 mg total) by mouth 3 (three) times daily with meals for 7 days. 10/31/17 11/07/17  Jaionna Weisse A, PA-C  oxyCODONE-acetaminophen (PERCOCET/ROXICET) 5-325 MG tablet Take 1 tablet by mouth every 8 (eight) hours as needed for severe pain. 10/31/17   Jackline Castilla, Coral Else, PA-C    Family History History reviewed. No pertinent family history.  Social History Social History   Tobacco Use  . Smoking status: Current Every Day Smoker    Packs/day: 0.50    Types: Cigarettes  . Smokeless tobacco: Never Used  Substance Use Topics  . Alcohol use: No  . Drug use: Yes    Types: IV, Heroin    Comment: heroin     Allergies   Bee venom   Review of Systems Review of Systems  Constitutional: Positive for chills. Negative for fever.  HENT: Negative for congestion.   Eyes: Negative for visual disturbance.  Respiratory: Negative for shortness of  breath.   Cardiovascular: Negative for chest pain.  Gastrointestinal: Negative for diarrhea, nausea and vomiting.  Musculoskeletal: Positive for arthralgias, gait problem and myalgias. Negative for neck pain and neck stiffness.  Skin: Positive for color change. Negative for rash.  Allergic/Immunologic: Negative for immunocompromised state.  Neurological: Negative for dizziness, weakness and numbness.  Psychiatric/Behavioral: Negative for confusion.   Physical Exam Updated Vital Signs BP 117/60   Pulse 95   Temp 99.6 F (37.6 C) (Oral)   Resp 16   Ht 5\' 11"   (1.803 m)   Wt 102.1 kg (225 lb)   SpO2 93%   BMI 31.38 kg/m   Physical Exam  Constitutional: He appears well-developed.  HENT:  Head: Normocephalic.  Eyes: Conjunctivae are normal.  Neck: Neck supple.  Cardiovascular: Normal rate and regular rhythm.  No murmur heard. Pulmonary/Chest: Effort normal. No stridor. No respiratory distress. He has no wheezes. He has no rales. He exhibits no tenderness.  Abdominal: Soft. He exhibits no distension.  Musculoskeletal:  Mild, nonpitting edema noted to the bilateral lower extremities.  Full active and passive range of motion of the right ankle. Erythema, warmth, and edema is noted over the lateral malleolus extending along the lateral aspect of the right foot (see picture below). Erythema is noted over the lower leg. Erythema, edema, and warmth is noted over the first MTP.   Decreased range of motion secondary to pain of the left ankle.  Erythema, warmth, and edema is noted over the first MTP.  Erythema extends from the first MTP along the medial aspect of the foot to the medial malleolus.  Diffuse erythema of the lower leg is noted on exam.   DP and PT pulses are 2+ and intact.  Sensation is intact throughout the bilateral lower extremities. Capillary refill <2 seconds.   Neurological: He is alert.  Skin: Skin is warm and dry.  Numerous track marks noted throughout the bilateral arms and hands.   Psychiatric: His behavior is normal.  Nursing note and vitals reviewed.  Right lower leg      Left lower leg        ED Treatments / Results  Labs (all labs ordered are listed, but only abnormal results are displayed) Labs Reviewed  CBC WITH DIFFERENTIAL/PLATELET - Abnormal; Notable for the following components:      Result Value   WBC 12.3 (*)    Hemoglobin 12.7 (*)    Neutro Abs 8.0 (*)    Monocytes Absolute 1.3 (*)    All other components within normal limits  COMPREHENSIVE METABOLIC PANEL - Abnormal; Notable for the following  components:   Chloride 97 (*)    Glucose, Bld 140 (*)    Calcium 8.7 (*)    Albumin 3.2 (*)    Total Bilirubin 0.2 (*)    All other components within normal limits    EKG  EKG Interpretation None       Radiology No results found.  Procedures Procedures (including critical care time)  Medications Ordered in ED Medications  oxyCODONE-acetaminophen (PERCOCET/ROXICET) 5-325 MG per tablet 1 tablet (1 tablet Oral Given 10/31/17 0008)  cephALEXin (KEFLEX) capsule 500 mg (500 mg Oral Given 10/31/17 0133)     Initial Impression / Assessment and Plan / ED Course  I have reviewed the triage vital signs and the nursing notes.  Pertinent labs & imaging results that were available during my care of the patient were reviewed by me and considered in my medical decision making (see chart for  details).     52 year old male presenting with bilateral lower extremity pain, erythema, and edema and chills for 3-4 days.  Afebrile with rectal temp in the ED. Leukocytosis of 12.3.  On exam, the patient has no murmurs, rubs or gallops or no tenderness to palpation of the lumbar spine.  The patient also has numerous track marks noted throughout the bilateral upper extremities.  Review of the patient's chart indicates a h/o of IV heroin. Discussed these findings with the patient. He is adamant that he has not injected into the bilateral feet or lower extremities. Doubt septic arthritis. He is also angry and states "I knew you wouldn't treat my pain once you knew I was a heroin user." Discussed with the patient that Percocet was appropriate treatment for pain control for gouty arthritis flare.  Discussed patient with Dr. Wilkie Aye, attending physician. Will treat the patient with Keflex for cellulitis of the right lower extremity and gouty arthritis with indomethacin, which the patient reports has been very successful in treating like previous gout flares.  He was given strict return precautions if symptoms worsen.   Will send him home with a short course of Percocet with strict instructions not to use with other substances that can decrease his respiratory drive.  On reevaluation, the patient initially appeared sedated after Percocet with SaO2 of 92% on the monitor with good wave for, but quickly improved. GCS 15, and the patient's vital signs were appropriate prior to discharge. NAD.  Patient is safe for discharge at this time.    Final Clinical Impressions(s) / ED Diagnoses   Final diagnoses:  Cellulitis of right lower extremity  Gouty arthritis of both feet    ED Discharge Orders        Ordered    cephALEXin (KEFLEX) 500 MG capsule  4 times daily     10/31/17 0125    indomethacin (INDOCIN) 50 MG capsule  3 times daily with meals     10/31/17 0125    oxyCODONE-acetaminophen (PERCOCET/ROXICET) 5-325 MG tablet  Every 8 hours PRN     10/31/17 0125       Marithza Malachi, Pedro Earls A, PA-C 10/31/17 0142    Shon Baton, MD 10/31/17 [phone]

## 2017-10-31 ENCOUNTER — Encounter (HOSPITAL_COMMUNITY): Payer: Self-pay | Admitting: Adult Health

## 2017-10-31 LAB — COMPREHENSIVE METABOLIC PANEL
ALK PHOS: 80 U/L (ref 38–126)
ALT: 26 U/L (ref 17–63)
AST: 33 U/L (ref 15–41)
Albumin: 3.2 g/dL — ABNORMAL LOW (ref 3.5–5.0)
Anion gap: 10 (ref 5–15)
BUN: 13 mg/dL (ref 6–20)
CALCIUM: 8.7 mg/dL — AB (ref 8.9–10.3)
CO2: 28 mmol/L (ref 22–32)
CREATININE: 1.17 mg/dL (ref 0.61–1.24)
Chloride: 97 mmol/L — ABNORMAL LOW (ref 101–111)
GFR calc non Af Amer: >60 mL/min (ref >60)
Glucose, Bld: 140 mg/dL — ABNORMAL HIGH (ref 65–99)
Potassium: 4.3 mmol/L (ref 3.5–5.1)
SODIUM: 135 mmol/L (ref 135–145)
Total Bilirubin: 0.2 mg/dL — ABNORMAL LOW (ref 0.3–1.2)
Total Protein: 7.9 g/dL (ref 6.5–8.1)

## 2017-10-31 LAB — CBC WITH DIFFERENTIAL/PLATELET
Basophils Absolute: 0 10*3/uL (ref 0.0–0.1)
Basophils Relative: 0 %
EOS ABS: 0.3 10*3/uL (ref 0.0–0.7)
Eosinophils Relative: 3 %
HCT: 39.3 % (ref 39.0–52.0)
HEMOGLOBIN: 12.7 g/dL — AB (ref 13.0–17.0)
LYMPHS ABS: 2.6 10*3/uL (ref 0.7–4.0)
Lymphocytes Relative: 22 %
MCH: 28.5 pg (ref 26.0–34.0)
MCHC: 32.3 g/dL (ref 30.0–36.0)
MCV: 88.3 fL (ref 78.0–100.0)
Monocytes Absolute: 1.3 10*3/uL — ABNORMAL HIGH (ref 0.1–1.0)
Monocytes Relative: 10 %
NEUTROS PCT: 65 %
Neutro Abs: 8 10*3/uL — ABNORMAL HIGH (ref 1.7–7.7)
Platelets: 380 10*3/uL (ref 150–400)
RBC: 4.45 MIL/uL (ref 4.22–5.81)
RDW: 14.1 % (ref 11.5–15.5)
WBC: 12.3 10*3/uL — AB (ref 4.0–10.5)

## 2017-10-31 MED ORDER — CEPHALEXIN 500 MG PO CAPS
500.0000 mg | ORAL_CAPSULE | Freq: Once | ORAL | Status: AC
  Administered 2017-10-31: 500 mg via ORAL
  Filled 2017-10-31: qty 1

## 2017-10-31 MED ORDER — INDOMETHACIN 50 MG PO CAPS: 50.0000 mg | ORAL_CAPSULE | Freq: Three times a day (TID) | ORAL | 0 refills | Status: AC

## 2017-10-31 MED ORDER — OXYCODONE-ACETAMINOPHEN 5-325 MG PO TABS
1.0000 | ORAL_TABLET | Freq: Once | ORAL | Status: AC
  Administered 2017-10-31: 1 via ORAL
  Filled 2017-10-31: qty 1

## 2017-10-31 MED ORDER — OXYCODONE-ACETAMINOPHEN 5-325 MG PO TABS: 1.0000 | ORAL_TABLET | Freq: Three times a day (TID) | ORAL | 0 refills | Status: DC | PRN

## 2017-10-31 MED ORDER — CEPHALEXIN 500 MG PO CAPS: 500.0000 mg | ORAL_CAPSULE | Freq: Four times a day (QID) | ORAL | 0 refills | Status: AC

## 2017-10-31 NOTE — ED Notes (Signed)
Pt verbalizes anger regarding pain medication stating that one percocet will not help him.

## 2017-10-31 NOTE — Discharge Instructions (Signed)
Cellulitis is a skin infection.  Take 1 tablet of Keflex every 6 hours for the next 7 days.  Your first dose was given tonight in the emergency department.  For your gout flare, take 50 mg of indomethacin every 8 hours until your gout pain resolves.  Please do not continue taking indomethacin after your symptoms improve.  Please do not take Motrin, ibuprofen, or naproxen while taking this medication.  It is important that you take this medication with food so that it does not upset your stomach or put you at risk for getting an ulcer.  For mild to moderate pain, take 1000 mg Tylenol once every 8 hour for severe pain, you may take 1 tablet of Percocet every 8 hours.  Percocet also contains 325 mg of Tylenol in each tablet.  Do not take more than 4000 mg of Tylenol in a 24-hour period because it can impact your liver.   Please note Percocet is a narcotic medication and should not be taken with other types of narcotic medication because it can cause you to stop breathing if you overdose.  Please do not drive or work while taking this medication because it can cause you to be impaired.  This medication is addicting and should not be shared with other people he did have a prescription.  If you develop new or worsening symptoms, including worsening redness, pain or swelling after taking Keflex for more than 48-72 hours, fever that does not resolve with Tylenol, or other new concerning symptoms, please return to the emergency department for re-evaluation.

## 2018-03-10 ENCOUNTER — Encounter (HOSPITAL_COMMUNITY): Payer: Self-pay | Admitting: Emergency Medicine

## 2018-03-10 ENCOUNTER — Other Ambulatory Visit: Payer: Self-pay

## 2018-03-10 ENCOUNTER — Emergency Department (HOSPITAL_COMMUNITY): Payer: Self-pay

## 2018-03-10 ENCOUNTER — Inpatient Hospital Stay (HOSPITAL_COMMUNITY)
Admission: EM | Admit: 2018-03-10 | Discharge: 2018-03-12 | DRG: 603 | Disposition: A | Discharge status: Home / Self Care | Payer: Self-pay | Attending: Internal Medicine | Admitting: Internal Medicine

## 2018-03-10 DIAGNOSIS — X58XXXA Exposure to other specified factors, initial encounter: Secondary | ICD-10-CM | POA: Diagnosis present

## 2018-03-10 DIAGNOSIS — B3749 Other urogenital candidiasis: Secondary | ICD-10-CM | POA: Diagnosis present

## 2018-03-10 DIAGNOSIS — F1110 Opioid abuse, uncomplicated: Secondary | ICD-10-CM | POA: Diagnosis present

## 2018-03-10 DIAGNOSIS — N3090 Cystitis, unspecified without hematuria: Secondary | ICD-10-CM

## 2018-03-10 DIAGNOSIS — Z720 Tobacco use: Secondary | ICD-10-CM

## 2018-03-10 DIAGNOSIS — M62838 Other muscle spasm: Secondary | ICD-10-CM | POA: Diagnosis present

## 2018-03-10 DIAGNOSIS — N390 Urinary tract infection, site not specified: Secondary | ICD-10-CM | POA: Diagnosis present

## 2018-03-10 DIAGNOSIS — Z91030 Bee allergy status: Secondary | ICD-10-CM

## 2018-03-10 DIAGNOSIS — S025XXA Fracture of tooth (traumatic), initial encounter for closed fracture: Secondary | ICD-10-CM | POA: Diagnosis present

## 2018-03-10 DIAGNOSIS — F1910 Other psychoactive substance abuse, uncomplicated: Secondary | ICD-10-CM | POA: Diagnosis present

## 2018-03-10 DIAGNOSIS — M109 Gout, unspecified: Secondary | ICD-10-CM | POA: Diagnosis present

## 2018-03-10 DIAGNOSIS — L03211 Cellulitis of face: Principal | ICD-10-CM | POA: Diagnosis present

## 2018-03-10 DIAGNOSIS — F17210 Nicotine dependence, cigarettes, uncomplicated: Secondary | ICD-10-CM | POA: Diagnosis present

## 2018-03-10 DIAGNOSIS — F1410 Cocaine abuse, uncomplicated: Secondary | ICD-10-CM | POA: Diagnosis present

## 2018-03-10 DIAGNOSIS — R509 Fever, unspecified: Secondary | ICD-10-CM

## 2018-03-10 DIAGNOSIS — Z87442 Personal history of urinary calculi: Secondary | ICD-10-CM

## 2018-03-10 DIAGNOSIS — K029 Dental caries, unspecified: Secondary | ICD-10-CM

## 2018-03-10 HISTORY — DX: Other psychoactive substance abuse, uncomplicated: F19.10

## 2018-03-10 LAB — CBC WITH DIFFERENTIAL/PLATELET
BASOS ABS: 0 10*3/uL (ref 0.0–0.1)
Basophils Relative: 0 %
Eosinophils Absolute: 0.3 10*3/uL (ref 0.0–0.7)
Eosinophils Relative: 2 %
HCT: 39.8 % (ref 39.0–52.0)
HEMOGLOBIN: 12.9 g/dL — AB (ref 13.0–17.0)
LYMPHS PCT: 12 %
Lymphs Abs: 1.4 10*3/uL (ref 0.7–4.0)
MCH: 28.2 pg (ref 26.0–34.0)
MCHC: 32.4 g/dL (ref 30.0–36.0)
MCV: 87.1 fL (ref 78.0–100.0)
MONO ABS: 1.3 10*3/uL — AB (ref 0.1–1.0)
Monocytes Relative: 11 %
NEUTROS ABS: 8.9 10*3/uL — AB (ref 1.7–7.7)
NEUTROS PCT: 75 %
Platelets: 370 10*3/uL (ref 150–400)
RBC: 4.57 MIL/uL (ref 4.22–5.81)
RDW: 15 % (ref 11.5–15.5)
WBC: 11.9 10*3/uL — ABNORMAL HIGH (ref 4.0–10.5)

## 2018-03-10 LAB — LACTIC ACID, PLASMA
LACTIC ACID, VENOUS: 0.5 mmol/L (ref 0.5–1.9)
Lactic Acid, Venous: 1.5 mmol/L (ref 0.5–1.9)

## 2018-03-10 LAB — URINALYSIS, ROUTINE W REFLEX MICROSCOPIC
Bilirubin Urine: NEGATIVE
GLUCOSE, UA: NEGATIVE mg/dL
KETONES UR: NEGATIVE mg/dL
Nitrite: POSITIVE — AB
PROTEIN: NEGATIVE mg/dL
Specific Gravity, Urine: 1.009 (ref 1.005–1.030)
WBC, UA: >50 WBC/hpf — ABNORMAL HIGH (ref 0–5)
pH: 7 (ref 5.0–8.0)

## 2018-03-10 LAB — BASIC METABOLIC PANEL
ANION GAP: 8 (ref 5–15)
BUN: 15 mg/dL (ref 6–20)
CALCIUM: 8.9 mg/dL (ref 8.9–10.3)
CHLORIDE: 97 mmol/L — AB (ref 101–111)
CO2: 28 mmol/L (ref 22–32)
Creatinine, Ser: 1.07 mg/dL (ref 0.61–1.24)
GFR calc Af Amer: >60 mL/min (ref >60)
GFR calc non Af Amer: >60 mL/min (ref >60)
GLUCOSE: 146 mg/dL — AB (ref 65–99)
Potassium: 4.1 mmol/L (ref 3.5–5.1)
Sodium: 133 mmol/L — ABNORMAL LOW (ref 135–145)

## 2018-03-10 LAB — RAPID URINE DRUG SCREEN, HOSP PERFORMED
AMPHETAMINES: NOT DETECTED
BENZODIAZEPINES: NOT DETECTED
Barbiturates: NOT DETECTED
COCAINE: POSITIVE — AB
Opiates: POSITIVE — AB
Tetrahydrocannabinol: NOT DETECTED

## 2018-03-10 LAB — ETHANOL: Alcohol, Ethyl (B): <10 mg/dL (ref <10)

## 2018-03-10 MED ORDER — KETOROLAC TROMETHAMINE 30 MG/ML IJ SOLN
30.0000 mg | Freq: Four times a day (QID) | INTRAMUSCULAR | Status: DC | PRN
  Filled 2018-03-10: qty 1

## 2018-03-10 MED ORDER — SODIUM CHLORIDE 0.9 % IV SOLN
1.0000 g | Freq: Once | INTRAVENOUS | Status: AC
  Administered 2018-03-10: 1 g via INTRAVENOUS
  Filled 2018-03-10: qty 10

## 2018-03-10 MED ORDER — ACETAMINOPHEN 325 MG PO TABS: 650.0000 mg | ORAL_TABLET | Freq: Four times a day (QID) | ORAL | Status: DC | PRN

## 2018-03-10 MED ORDER — ENOXAPARIN SODIUM 40 MG/0.4ML ~~LOC~~ SOLN
40.0000 mg | SUBCUTANEOUS | Status: DC
  Filled 2018-03-10 (×3): qty 0.4

## 2018-03-10 MED ORDER — ACETAMINOPHEN 650 MG RE SUPP: 650.0000 mg | Freq: Four times a day (QID) | RECTAL | Status: DC | PRN

## 2018-03-10 MED ORDER — ONDANSETRON HCL 4 MG/2ML IJ SOLN: 4.0000 mg | Freq: Four times a day (QID) | INTRAMUSCULAR | Status: DC | PRN

## 2018-03-10 MED ORDER — FENTANYL CITRATE (PF) 100 MCG/2ML IJ SOLN
50.0000 ug | INTRAMUSCULAR | Status: DC | PRN
  Administered 2018-03-10: 50 ug via INTRAVENOUS
  Filled 2018-03-10: qty 2

## 2018-03-10 MED ORDER — SODIUM CHLORIDE 0.9 % IV SOLN
INTRAVENOUS | Status: DC
  Administered 2018-03-10 – 2018-03-12 (×3): via INTRAVENOUS

## 2018-03-10 MED ORDER — IOPAMIDOL (ISOVUE-300) INJECTION 61%
75.0000 mL | Freq: Once | INTRAVENOUS | Status: AC | PRN
  Administered 2018-03-10: 75 mL via INTRAVENOUS

## 2018-03-10 MED ORDER — VITAMIN B-1 100 MG PO TABS
100.0000 mg | ORAL_TABLET | Freq: Every day | ORAL | Status: DC
  Administered 2018-03-11 – 2018-03-12 (×2): 100 mg via ORAL
  Filled 2018-03-10 (×2): qty 1

## 2018-03-10 MED ORDER — FLUCONAZOLE 100 MG PO TABS
200.0000 mg | ORAL_TABLET | Freq: Every day | ORAL | Status: DC
  Administered 2018-03-10 – 2018-03-12 (×3): 200 mg via ORAL
  Filled 2018-03-10 (×3): qty 2

## 2018-03-10 MED ORDER — ONDANSETRON HCL 4 MG PO TABS: 4.0000 mg | ORAL_TABLET | Freq: Four times a day (QID) | ORAL | Status: DC | PRN

## 2018-03-10 MED ORDER — FOLIC ACID 1 MG PO TABS
1.0000 mg | ORAL_TABLET | Freq: Every day | ORAL | Status: DC
  Administered 2018-03-11 – 2018-03-12 (×2): 1 mg via ORAL
  Filled 2018-03-10 (×2): qty 1

## 2018-03-10 MED ORDER — ACETAMINOPHEN 650 MG RE SUPP
650.0000 mg | Freq: Once | RECTAL | Status: AC
  Administered 2018-03-10: 650 mg via RECTAL
  Filled 2018-03-10: qty 1

## 2018-03-10 NOTE — H&P (Addendum)
History and Physical  [name] AVW:[id number] DOB: 04-02-1966 DOA: 03/10/2018  Referring physician: Dr Clarene Duke, ED physician PCP: Patient, No Pcp Per  Outpatient Specialists:  Patient Coming From: home  Chief Complaint: Fever, facial swelling  HPI: [name] is a 52 y.o. male with a history of polysubstance abuse, gout.  Patient seen for facial swelling for the past week.  Swelling is worsening.  Per family, the patient has been picking at his teeth and has a couple broken teeth.  Patient developed fever over the past day and came to the hospital due to feeling bad.  No palliating or provoking factors.  Patient states that he did a line of cocaine and Lortab yesterday.  Denies IV drug use or sharing drug paraphernalia with others.   Emergency Department Course: White count elevated 11.9.  Lactic acid 1.5.  UA significant for large leukocytes and positive nitrites with rare bacteria and budding yeast.  UDS significant for cocaine opiates.  Review of Systems:   Pt denies any fevers, chills, nausea, vomiting, diarrhea, constipation, abdominal pain, shortness of breath, dyspnea on exertion, orthopnea, cough, wheezing, palpitations, headache, vision changes, lightheadedness, dizziness, melena, rectal bleeding.  Review of systems are otherwise negative  Past Medical History:  Diagnosis Date  . Gout   . Kidney stones   . 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 Leticia Penna  . Substance abuse [facility])    Past Surgical History:  Procedure Laterality Date  . CYSTOSCOPY WITH HOLMIUM LASER LITHOTRIPSY Left 07/31/2017   Procedure: LEFT URETEROSCOPY,LEFT URETERAL STENT PLACEMENT;  Surgeon: Ihor Gully, MD;  Location: [facility];  Service: Urology;  Laterality: Left;  . HERNIA REPAIR    . IR URETERAL STENT LEFT NEW ACCESS W/O SEP NEPHROSTOMY CATH  07/31/2017  . NEPHROLITHOTOMY Left 07/31/2017   Procedure: NEPHROLITHOTOMY PERCUTANEOUS;  Surgeon: Ihor Gully, MD;  Location: [facility];   Service: Urology;  Laterality: Left;  . urteral stent  2016   Social History:  reports that he has been smoking cigarettes.  He has been smoking about 0.50 packs per day. He has never used smokeless tobacco. He reports that he has current or past drug history. Drugs: IV and Heroin. He reports that he does not drink alcohol. Patient lives at home  Allergies  Allergen Reactions  . Bee Venom Shortness Of Breath and Swelling    No family history on file.  Patient unable to give family history  Prior to Admission medications   Medication Sig Start Date End Date Taking? Authorizing Provider  oxyCODONE-acetaminophen (PERCOCET/ROXICET) 5-325 MG tablet Take 1 tablet by mouth every 8 (eight) hours as needed for severe pain. Patient not taking: Reported on 03/10/2018 10/31/17   McDonald, Mia A, PA-C  senna-docusate (SENOKOT-S) 8.6-50 MG tablet Take 2 tablets by mouth at bedtime. Patient taking differently: Take 2 tablets by mouth daily as needed for mild constipation or moderate constipation.  08/01/17   Budd Palmer, MD    Physical Exam: BP 130/78   Pulse 83   Temp 99.9 F (37.7 C) (Oral)   Resp 16   Ht  (1.803 m)   Wt 102.1 kg (225 lb)   SpO2 93%   BMI 31.38 kg/m   . General: Middle-aged Caucasian male.  Somnolent, but becomes awake and alert and oriented x3. No acute cardiopulmonary distress.  Marland Kitchen HEENT: Normocephalic atraumatic.  Right and left ears normal in appearance.  Pupils equal, round, reactive to light. Extraocular muscles are intact. Sclerae anicteric and noninjected.  Moist mucosal membranes.  Multiple broken teeth with bad dentition.  There is gross swelling and tenderness of the lower oral mucosa on the left side.  No fluctuant areas palpated. . Neck: Neck supple without lymphadenopathy. No carotid bruits. No masses palpated.  . Cardiovascular: Regular rate with normal S1-S2 sounds. No murmurs, rubs, gallops auscultated. No JVD.  Marland Kitchen Respiratory: Good respiratory  effort with no wheezes, rales, rhonchi. Lungs clear to auscultation bilaterally.  No accessory muscle use. . Abdomen: Soft, nontender, nondistended. Active bowel sounds. No masses or hepatosplenomegaly  . Skin: No rashes, lesions, or ulcerations.  Dry, warm to touch. 2+ dorsalis pedis and radial pulses. . Musculoskeletal: No calf or leg pain. All major joints not erythematous nontender.  No upper or lower joint deformation.  Good ROM.  No contractures  . Psychiatric: Intact judgment and insight. Pleasant and cooperative. . Neurologic: No focal neurological deficits. Strength is 5/5 and symmetric in upper and lower extremities.  Cranial nerves II through XII are grossly intact.           Labs on Admission: I have personally reviewed following labs and imaging studies  CBC: Recent Labs  Lab 03/10/18 1937  WBC 11.9*  NEUTROABS 8.9*  HGB 12.9*  HCT 39.8  MCV 87.1  PLT 370   Basic Metabolic Panel: Recent Labs  Lab 03/10/18 1937  NA 133*  K 4.1  CL 97*  CO2 28  GLUCOSE 146*  BUN 15  CREATININE 1.07  CALCIUM 8.9   GFR: Estimated Creatinine Clearance: 99.4 mL/min (by C-G formula based on SCr of 1.07 mg/dL). Liver Function Tests: No results for input(s): AST, ALT, ALKPHOS, BILITOT, PROT, ALBUMIN in the last 168 hours. No results for input(s): LIPASE, AMYLASE in the last 168 hours. No results for input(s): AMMONIA in the last 168 hours. Coagulation Profile: No results for input(s): INR, PROTIME in the last 168 hours. Cardiac Enzymes: No results for input(s): CKTOTAL, CKMB, CKMBINDEX, TROPONINI in the last 168 hours. BNP (last 3 results) No results for input(s): PROBNP in the last 8760 hours. HbA1C: No results for input(s): HGBA1C in the last 72 hours. CBG: No results for input(s): GLUCAP in the last 168 hours. Lipid Profile: No results for input(s): CHOL, HDL, LDLCALC, TRIG, CHOLHDL, LDLDIRECT in the last 72 hours. Thyroid Function Tests: No results for input(s): TSH,  T4TOTAL, FREET4, T3FREE, THYROIDAB in the last 72 hours. Anemia Panel: No results for input(s): VITAMINB12, FOLATE, FERRITIN, TIBC, IRON, RETICCTPCT in the last 72 hours. Urine analysis:    Component Value Date/Time   COLORURINE YELLOW 03/10/2018 1956   APPEARANCEUR HAZY (A) 03/10/2018 1956   LABSPEC 1.009 03/10/2018 1956   PHURINE 7.0 03/10/2018 1956   GLUCOSEU NEGATIVE 03/10/2018 1956   HGBUR MODERATE (A) 03/10/2018 1956   BILIRUBINUR NEGATIVE 03/10/2018 1956   KETONESUR NEGATIVE 03/10/2018 1956   PROTEINUR NEGATIVE 03/10/2018 1956   UROBILINOGEN 0.2 12/25/2014 1600   NITRITE POSITIVE (A) 03/10/2018 1956   LEUKOCYTESUR LARGE (A) 03/10/2018 1956   Sepsis Labs: (procalcitonin:4,lacticidven:4) ) Recent Results (from the past 240 hour(s))  Culture, blood (routine x 2)     Status: None (Preliminary result)   Collection Time: 03/10/18  7:39 PM  Result Value Ref Range Status   Specimen Description BLOOD LEFT ARM  Final   Special Requests   Final    BOTTLES DRAWN AEROBIC AND ANAEROBIC Blood Culture adequate volume Performed at [facility], [address], [place], [place] [address]    Culture PENDING  Incomplete  Report Status PENDING  Incomplete  Culture, blood (routine x 2)     Status: None (Preliminary result)   Collection Time: 03/10/18  7:45 PM  Result Value Ref Range Status   Specimen Description BLOOD RIGHT HAND  Final   Special Requests   Final    BOTTLES DRAWN AEROBIC AND ANAEROBIC Blood Culture adequate volume Performed at [facility], [address], [place], [place] [address]    Culture PENDING  Incomplete   Report Status PENDING  Incomplete     Radiological Exams on Admission: Dg Chest 2 View  Result Date: 03/10/2018 CLINICAL DATA:  Fever EXAM: CHEST - 2 VIEW COMPARISON:  07/26/2012 FINDINGS: Heart and mediastinal contours are within normal limits. No focal opacities or effusions. No acute bony abnormality. IMPRESSION: No active cardiopulmonary  disease. Electronically Signed   By: Charlett Nose M.D.   On: 03/10/2018 21:18   Ct Maxillofacial W Contrast  Result Date: 03/10/2018 CLINICAL DATA:  Left facial swelling EXAM: CT MAXILLOFACIAL WITH CONTRAST TECHNIQUE: Multidetector CT imaging of the maxillofacial structures was performed with intravenous contrast. Multiplanar CT image reconstructions were also generated. CONTRAST:  75mL ISOVUE-300 IOPAMIDOL (ISOVUE-300) INJECTION 61% COMPARISON:  None. FINDINGS: Osseous: Poor dentition with multiple caries. Small periapical lucency around left lower first molar. There appears to be erosion of the lateral cortex of the mandible with adjacent soft tissue swelling due to infection in the soft tissues. Chronic nasal bone fracture Orbits: Normal orbit bilaterally Sinuses: Mild mucosal edema in the sinuses.  No air-fluid level Soft tissues: Extensive soft tissue swelling lateral to the mandible on the left. There is diffuse subcutaneous fatty infiltration as well as edema extending down to the mandible. No fluid collection identified Limited intracranial: Negative IMPRESSION: Extensive soft tissue swelling lateral to the mandible on the left. No drainable abscess in the soft tissues. This appears to be of dental origin due to periapical infection around left lower first molar. Overall poor dentition with multiple caries. Electronically Signed   By: Marlan Palau M.D.   On: 03/10/2018 21:25    EKG: Independently reviewed.  Sinus rhythm with early R wave progression  Assessment/Plan: Principal Problem:   Facial cellulitis Active Problems:   Acute lower UTI   Polysubstance abuse [facility])    This patient was discussed with the ED physician, including pertinent vitals, physical exam findings, labs, and imaging.  We also discussed care given by the ED provider.  1. Facial cellulitis a. Admit b. Blood cultures obtained c. CT scan negative for abscess d. Continue ceftriaxone e. Will screen for MRSA, although  infection unlikely to be MRSA secondary to location -this probably stems from his poor dentition and a dental infection f. Toradol for pain g. Will avoid narcotics as the patient polysubstance abuser 2. Acute lower UTI a. Ceftriaxone will cover bacterial UTI b. Start fluconazole  daily for candidal UTI due to patient's fever. 3. Polysubstance abuse a. Likely etiology of patient's hypersomnolence b. Watch for signs of combativeness  DVT prophylaxis: Lovenox Consultants: None Code Status: Full code Family Communication: None Disposition Plan:    Levie Heritage, DO [facility] Pager [phone]  If 7PM-7AM, please contact night-coverage [email or web address] Password [email or web address]

## 2018-03-10 NOTE — ED Triage Notes (Addendum)
Patient complaining of dental swelling since last night. Patient has significant swelling noted to lower left jaw at triage. Patient is lethargic in triage but easily arouse able. Denies taking any medication prior to arrival. Per family member, patient has used IV heroin two days ago.

## 2018-03-10 NOTE — ED Provider Notes (Signed)
[facility] EMERGENCY DEPARTMENT Provider Note   CSN: [id number] Arrival date & time: 03/10/18  1901     History   Chief Complaint Chief Complaint  Patient presents with  . Oral Swelling    HPI [name] is a 52 y.o. male.  HPI  Pt was seen at 1920. Per pt, c/o gradual onset and worsening of persistent left lower facial "pain" and "swelling" that began yesterday. Has been associated with extensive dental decay. LD heroin 2 days ago per family; pt denies drug use. Denies CP/palpitations, no SOB/cough, no abd pain, no N/V/D, no neck or back pain, no sore throat, no dysphagia.    Past Medical History:  Diagnosis Date  . Gout   . Kidney stones   . 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 Leticia Penna  . Substance abuse [facility])     Patient Active Problem List   Diagnosis Date Noted  . Nephrolith 07/31/2017    Past Surgical History:  Procedure Laterality Date  . CYSTOSCOPY WITH HOLMIUM LASER LITHOTRIPSY Left 07/31/2017   Procedure: LEFT URETEROSCOPY,LEFT URETERAL STENT PLACEMENT;  Surgeon: Ihor Gully, MD;  Location: [facility];  Service: Urology;  Laterality: Left;  . HERNIA REPAIR    . IR URETERAL STENT LEFT NEW ACCESS W/O SEP NEPHROSTOMY CATH  07/31/2017  . NEPHROLITHOTOMY Left 07/31/2017   Procedure: NEPHROLITHOTOMY PERCUTANEOUS;  Surgeon: Ihor Gully, MD;  Location: [facility];  Service: Urology;  Laterality: Left;  . urteral stent  2016        Home Medications    Prior to Admission medications   Medication Sig Start Date End Date Taking? Authorizing Provider  oxyCODONE-acetaminophen (PERCOCET/ROXICET) 5-325 MG tablet Take 1 tablet by mouth every 8 (eight) hours as needed for severe pain. 10/31/17   McDonald, Mia A, PA-C  senna-docusate (SENOKOT-S) 8.6-50 MG tablet Take 2 tablets by mouth at bedtime. Patient taking differently: Take 2 tablets by mouth daily as needed for mild constipation or moderate constipation.  08/01/17   Budd Palmer, MD    Family  History No family history on file.  Social History Social History   Tobacco Use  . Smoking status: Current Every Day Smoker    Packs/day: 0.50    Types: Cigarettes  . Smokeless tobacco: Never Used  Substance Use Topics  . Alcohol use: No  . Drug use: Yes    Types: IV, Heroin    Comment: heroin     Allergies   Bee venom   Review of Systems Review of Systems ROS: Statement: All systems negative except as marked or noted in the HPI; Constitutional: +fever and chills. ; ; Eyes: Negative for eye pain, redness and discharge. ; ; ENMT: +left lower facial swelling, dental decay. Negative for ear pain, hoarseness, nasal congestion, sinus pressure and sore throat. ; ; Cardiovascular: Negative for chest pain, palpitations, diaphoresis, dyspnea and peripheral edema. ; ; Respiratory: Negative for cough, wheezing and stridor. ; ; Gastrointestinal: Negative for nausea, vomiting, diarrhea, abdominal pain, blood in stool, hematemesis, jaundice and rectal bleeding. . ; ; Genitourinary: Negative for dysuria, flank pain and hematuria. ; ; Musculoskeletal: Negative for back pain and neck pain. Negative for swelling and trauma.; ; Skin: Negative for pruritus, rash, abrasions, blisters, bruising and skin lesion.; ; Neuro: Negative for headache, lightheadedness and neck stiffness. Negative for weakness, altered level of consciousness, altered mental status, extremity weakness, paresthesias, involuntary movement, seizure and syncope.       Physical Exam Updated Vital Signs BP (!) 153/78 (BP  Location: Right Arm)   Pulse 92   Temp (!) 102.2 F (39 C) (Oral)   Resp 18   Ht  (1.803 m)   Wt 102.1 kg (225 lb)   SpO2 97%   BMI 31.38 kg/m    Patient Vitals for the past 24 hrs:  BP Temp Temp src Pulse Resp SpO2 Height Weight  03/10/18 2130 130/78 - - 83 16 93 % - -  03/10/18 2116 - 99.9 F (37.7 C) Oral - - - - -  03/10/18 2115 140/71 - - 90 16 96 % - -  03/10/18 2030 136/84 - - 91 (!) 21 (!)  85 % - -  03/10/18 2000 (!) 143/78 - - 80 18 93 % - -  03/10/18 1930 135/75 - - 74 20 95 % - -  03/10/18 1915 137/84 - - 76 14 99 % - -  03/10/18 1907 (!) 153/78 (!) 102.2 F (39 C) Oral 92 18 97 % - -  03/10/18 1906 - - - - - -  (1.803 m) 102.1 kg (225 lb)     Physical Exam 1925: Physical examination: Vital signs and O2 SAT: Reviewed; Constitutional: Well developed, Well nourished, Well hydrated, In no acute distress; Head and Face: Normocephalic, Atraumatic; Eyes: EOMI, PERRL, No scleral icterus; ENMT: Mouth and pharynx normal, Poor dentition, Widespread dental decay, Left TM normal, Right TM normal, Mucous membranes moist, +lower left pre-molars and molars with extensive dental decay.  No gingival erythema, edema, fluctuance, or drainage.  No intra-oral edema. No open wounds. No submandibular or sublingual edema. No hoarse voice, no drooling, no stridor. No trismus.+left lower facial swelling, no overlying erythema.; Neck: Supple, Full range of motion, No meningeal signs.; Cardiovascular: Regular rate and rhythm, No gallop; Respiratory: Breath sounds clear & equal bilaterally, No wheezes.  Speaking full sentences with ease, Normal respiratory effort/excursion; Chest: Nontender, Movement normal; Abdomen: Soft, Nontender, Nondistended, Normal bowel sounds; Genitourinary: No CVA tenderness; Spine:  No midline CS, TS, LS tenderness.;; Extremities: Peripheral pulses normal, No tenderness, No edema, No calf edema or asymmetry. Multiple scattered scabs bilat LE's. No draining wounds..; Neuro: AA&Ox3, Major CN grossly intact.  Speech clear. No gross focal motor or sensory deficits in extremities.; Skin: Color normal, Warm, Dry.    ED Treatments / Results  Labs (all labs ordered are listed, but only abnormal results are displayed)   EKG EKG Interpretation  Date/Time:  Saturday Mar 10 2018 19:16:50 EDT Ventricular Rate:  76 PR Interval:    QRS Duration: 94 QT Interval:  353 QTC  Calculation: 397 R Axis:   31 Text Interpretation:  Sinus rhythm Abnormal R-wave progression, early transition When compared with ECG of 07/26/2012 No significant change was found Confirmed by Samuel Jester [phone]) on 03/10/2018 7:33:30 PM   Radiology   Procedures Procedures (including critical care time)  Medications Ordered in ED Medications  acetaminophen (TYLENOL) suppository 650 mg (has no administration in time range)  0.9 %  sodium chloride infusion (has no administration in time range)  fentaNYL (SUBLIMAZE) injection 50 mcg (has no administration in time range)     Initial Impression / Assessment and Plan / ED Course  I have reviewed the triage vital signs and the nursing notes.  Pertinent labs & imaging results that were available during my care of the patient were reviewed by me and considered in my medical decision making (see chart for details).  MDM Reviewed: previous chart, nursing note and vitals Reviewed previous: labs and ECG  Interpretation: labs, ECG and CT scan Total time providing critical care: 30-74 minutes. This excludes time spent performing separately reportable procedures and services. Consults: admitting MD   CRITICAL CARE Performed by: Laray Anger Total critical care time: 35 minutes Critical care time was exclusive of separately billable procedures and treating other patients. Critical care was necessary to treat or prevent imminent or life-threatening deterioration. Critical care was time spent personally by me on the following activities: development of treatment plan with patient and/or surrogate as well as nursing, discussions with consultants, evaluation of patient's response to treatment, examination of patient, obtaining history from patient or surrogate, ordering and performing treatments and interventions, ordering and review of laboratory studies, ordering and review of radiographic studies, pulse oximetry and re-evaluation of  patient's condition.  Results for orders placed or performed during the hospital encounter of 03/10/18  Culture, blood (routine x 2)  Result Value Ref Range   Specimen Description BLOOD LEFT ARM    Special Requests      BOTTLES DRAWN AEROBIC AND ANAEROBIC Blood Culture adequate volume Performed at [facility], [address] [place], [place] [address]    Culture PENDING    Report Status PENDING   Culture, blood (routine x 2)  Result Value Ref Range   Specimen Description BLOOD RIGHT HAND    Special Requests      BOTTLES DRAWN AEROBIC AND ANAEROBIC Blood Culture adequate volume Performed at [facility], [address], [place], [place] [address]    Culture PENDING    Report Status PENDING   Basic metabolic panel  Result Value Ref Range   Sodium 133 (L) 135 - 145 mmol/L   Potassium 4.1 3.5 - 5.1 mmol/L   Chloride 97 (L) 101 - 111 mmol/L   CO2 28 22 - 32 mmol/L   Glucose, Bld 146 (H) 65 - 99 mg/dL   BUN 15 6 - 20 mg/dL   Creatinine, Ser 0.98 0.61 - 1.24 mg/dL   Calcium 8.9 8.9 - 11.9 mg/dL   GFR calc non Af Amer >60 >60 mL/min   GFR calc Af Amer >60 >60 mL/min   Anion gap 8 5 - 15  Ethanol  Result Value Ref Range   Alcohol, Ethyl (B) <10 <10 mg/dL  Lactic acid, plasma  Result Value Ref Range   Lactic Acid, Venous 1.5 0.5 - 1.9 mmol/L  CBC with Differential  Result Value Ref Range   WBC 11.9 (H) 4.0 - 10.5 K/uL   RBC 4.57 4.22 - 5.81 MIL/uL   Hemoglobin 12.9 (L) 13.0 - 17.0 g/dL   HCT 14.7 82.9 - 56.2 %   MCV 87.1 78.0 - 100.0 fL   MCH 28.2 26.0 - 34.0 pg   MCHC 32.4 30.0 - 36.0 g/dL   RDW 13.0 86.5 - 78.4 %   Platelets 370 150 - 400 K/uL   Neutrophils Relative % 75 %   Neutro Abs 8.9 (H) 1.7 - 7.7 K/uL   Lymphocytes Relative 12 %   Lymphs Abs 1.4 0.7 - 4.0 K/uL   Monocytes Relative 11 %   Monocytes Absolute 1.3 (H) 0.1 - 1.0 K/uL   Eosinophils Relative 2 %   Eosinophils Absolute 0.3 0.0 - 0.7 K/uL   Basophils Relative 0 %   Basophils Absolute 0.0 0.0 - 0.1  K/uL  Urine rapid drug screen (hosp performed)  Result Value Ref Range   Opiates POSITIVE (A) NONE DETECTED   Cocaine POSITIVE (A) NONE DETECTED   Benzodiazepines NONE DETECTED NONE DETECTED  Amphetamines NONE DETECTED NONE DETECTED   Tetrahydrocannabinol NONE DETECTED NONE DETECTED   Barbiturates NONE DETECTED NONE DETECTED  Urinalysis, Routine w reflex microscopic  Result Value Ref Range   Color, Urine YELLOW YELLOW   APPearance HAZY (A) CLEAR   Specific Gravity, Urine 1.009 1.005 - 1.030   pH 7.0 5.0 - 8.0   Glucose, UA NEGATIVE NEGATIVE mg/dL   Hgb urine dipstick MODERATE (A) NEGATIVE   Bilirubin Urine NEGATIVE NEGATIVE   Ketones, ur NEGATIVE NEGATIVE mg/dL   Protein, ur NEGATIVE NEGATIVE mg/dL   Nitrite POSITIVE (A) NEGATIVE   Leukocytes, UA LARGE (A) NEGATIVE   RBC / HPF 21-50 0 - 5 RBC/hpf   WBC, UA >50 (H) 0 - 5 WBC/hpf   Bacteria, UA RARE (A) NONE SEEN   WBC Clumps PRESENT    Budding Yeast PRESENT     Dg Chest 2 View Result Date: 03/10/2018 CLINICAL DATA:  Fever EXAM: CHEST - 2 VIEW COMPARISON:  07/26/2012 FINDINGS: Heart and mediastinal contours are within normal limits. No focal opacities or effusions. No acute bony abnormality. IMPRESSION: No active cardiopulmonary disease. Electronically Signed   By: Charlett Nose M.D.   On: 03/10/2018 21:18   Ct Maxillofacial W Contrast Result Date: 03/10/2018 CLINICAL DATA:  Left facial swelling EXAM: CT MAXILLOFACIAL WITH CONTRAST TECHNIQUE: Multidetector CT imaging of the maxillofacial structures was performed with intravenous contrast. Multiplanar CT image reconstructions were also generated. CONTRAST:  75mL ISOVUE-300 IOPAMIDOL (ISOVUE-300) INJECTION 61% COMPARISON:  None. FINDINGS: Osseous: Poor dentition with multiple caries. Small periapical lucency around left lower first molar. There appears to be erosion of the lateral cortex of the mandible with adjacent soft tissue swelling due to infection in the soft tissues. Chronic  nasal bone fracture Orbits: Normal orbit bilaterally Sinuses: Mild mucosal edema in the sinuses.  No air-fluid level Soft tissues: Extensive soft tissue swelling lateral to the mandible on the left. There is diffuse subcutaneous fatty infiltration as well as edema extending down to the mandible. No fluid collection identified Limited intracranial: Negative IMPRESSION: Extensive soft tissue swelling lateral to the mandible on the left. No drainable abscess in the soft tissues. This appears to be of dental origin due to periapical infection around left lower first molar. Overall poor dentition with multiple caries. Electronically Signed   By: Marlan Palau M.D.   On: 03/10/2018 21:25    2100:  Family is now here. States pt was "picking in his mouth at a broken tooth yesterday."   2150:  CT without abscess, as above. +UTI, UC and BC pending. IV rocephin given to cover for facial cellulitis and UTI. Fever improved with APAP. VS otherwise remain stable.  Dx and testing d/w pt and family.  Questions answered.  Verb understanding, agreeable to admit.  T/C returned from [facility] Adrian Blackwater, case discussed, including:  HPI, pertinent PM/SHx, VS/PE, dx testing, ED course and treatment:  Agreeable to admit.        Final Clinical Impressions(s) / ED Diagnoses   Final diagnoses:  None    ED Discharge Orders    None       Samuel Jester, DO 03/14/18 1610

## 2018-03-11 DIAGNOSIS — D72829 Elevated white blood cell count, unspecified: Secondary | ICD-10-CM

## 2018-03-11 LAB — BASIC METABOLIC PANEL
Anion gap: 10 (ref 5–15)
BUN: 11 mg/dL (ref 6–20)
CHLORIDE: 100 mmol/L — AB (ref 101–111)
CO2: 26 mmol/L (ref 22–32)
CREATININE: 0.94 mg/dL (ref 0.61–1.24)
Calcium: 8.8 mg/dL — ABNORMAL LOW (ref 8.9–10.3)
Glucose, Bld: 108 mg/dL — ABNORMAL HIGH (ref 65–99)
POTASSIUM: 3.9 mmol/L (ref 3.5–5.1)
SODIUM: 136 mmol/L (ref 135–145)

## 2018-03-11 LAB — MRSA PCR SCREENING: MRSA BY PCR: POSITIVE — AB

## 2018-03-11 LAB — CBC
HCT: 41 % (ref 39.0–52.0)
HEMOGLOBIN: 13.3 g/dL (ref 13.0–17.0)
MCH: 28.3 pg (ref 26.0–34.0)
MCHC: 32.4 g/dL (ref 30.0–36.0)
MCV: 87.2 fL (ref 78.0–100.0)
PLATELETS: 394 10*3/uL (ref 150–400)
RBC: 4.7 MIL/uL (ref 4.22–5.81)
RDW: 15.1 % (ref 11.5–15.5)
WBC: 13.1 10*3/uL — AB (ref 4.0–10.5)

## 2018-03-11 MED ORDER — TRAMADOL HCL 50 MG PO TABS
100.0000 mg | ORAL_TABLET | Freq: Three times a day (TID) | ORAL | Status: DC | PRN
  Administered 2018-03-11: 100 mg via ORAL
  Filled 2018-03-11: qty 2

## 2018-03-11 MED ORDER — MUPIROCIN 2 % EX OINT
1.0000 "application " | TOPICAL_OINTMENT | Freq: Two times a day (BID) | CUTANEOUS | Status: DC
  Administered 2018-03-11: 1 via NASAL
  Filled 2018-03-11: qty 22

## 2018-03-11 MED ORDER — IBUPROFEN 600 MG PO TABS
600.0000 mg | ORAL_TABLET | Freq: Three times a day (TID) | ORAL | Status: DC
  Administered 2018-03-11 – 2018-03-12 (×3): 600 mg via ORAL
  Filled 2018-03-11 (×3): qty 1

## 2018-03-11 MED ORDER — CHLORHEXIDINE GLUCONATE CLOTH 2 % EX PADS
6.0000 | MEDICATED_PAD | Freq: Every day | CUTANEOUS | Status: DC
  Administered 2018-03-12: 6 via TOPICAL

## 2018-03-11 NOTE — Progress Notes (Signed)
[facility] PROGRESS NOTE  [name] ZOX:[id number] DOB: Dec 09, 1965 DOA: 03/10/2018 PCP: Patient, No Pcp Per  Interim summary and HPI 52 y.o. male with a history of polysubstance abuse, gout.  Patient seen for facial swelling for the past week.  Swelling is worsening.  Per family, the patient has been picking at his teeth and has a couple broken teeth.  Patient developed fever over the past day and came to the hospital due to feeling bad.  No palliating or provoking factors.  Patient states that he did a line of cocaine and Lortab yesterday.  Denies IV drug use or sharing drug paraphernalia with others.    Assessment/Plan: 1-facial cellulitis -In the setting of poor dentition -start IV cleocin  -Ibuprofen on a scheduled dosages to assist with pain and swelling -Follow clinical response. -Patient needs outpatient dental evaluation and treatment. -Images done in the ED demonstrated no abscess at this time.  2-polysubstance abuse -UDS positive for cocaine and opiates -patient reported using cocaine and Lortab -cessation counseling provided -SW to provide outpatient resources -will avoid IV narcotics  3-candida UTI -continue treatment with diflucan X 3 days  -no nitrite on UA -patient denying dysuria    4-leukocytosis -due to #1 -continue IVF's and abx's -follow trend  Code Status: Full Family Communication: No family at bedside Disposition Plan: Change IV antibiotics to clindamycin, patient complaining of worsening pain his WBCs trending up; continue fluconazole for the treatment of Candida UTI; initiate scheduled dosages of ibuprofen to help with pain and swelling, PRN tramadol.  No IV narcotics.   Consultants:  None   Procedures:  See below for x-ray reports   Antibiotics:  Rocephin 5/18>>5/19  Clindamycin 03/11/18   HPI/Subjective: Complaining of pain and swelling on the left side of his face.  No chest pain or shortness of breath.  No  dysuria.  Objective: Vitals:   03/10/18 2249 03/11/18 0527  BP: 135/75 123/65  Pulse: 69 76  Resp: 18 18  Temp: 99.3 F (37.4 C) 99.7 F (37.6 C)  SpO2: 97% 95%    Intake/Output Summary (Last 24 hours) at 03/11/2018 1323 Last data filed at 03/11/2018 0600 Gross per 24 hour  Intake 430 ml  Output 450 ml  Net -20 ml   Filed Weights   03/10/18 1906 03/10/18 2249  Weight: 102.1 kg (225 lb) 98.1 kg (216 lb 4.3 oz)    Exam:   General: Low-grade temperature overnight, currently afebrile, no chest pain, no shortness of breath.  Patient still with significant swelling and pain on the left side of his face.  Denies dysuria, nausea, vomiting and abdominal pain.  Cardiovascular: S1 and S2, no rubs, no gallops, no JVD  Respiratory: Good air movement bilaterally, no wheezing, no crackles  Abdomen: Soft, nontender, nondistended, positive bowel sounds  Musculoskeletal: No edema, no cyanosis, no clubbing.  Data Reviewed: Basic Metabolic Panel: Recent Labs  Lab 03/10/18 1937 03/11/18 0614  NA 133* 136  K 4.1 3.9  CL 97* 100*  CO2 28 26  GLUCOSE 146* 108*  BUN 15 11  CREATININE 1.07 0.94  CALCIUM 8.9 8.8*   CBC: Recent Labs  Lab 03/10/18 1937 03/11/18 0614  WBC 11.9* 13.1*  NEUTROABS 8.9*  --   HGB 12.9* 13.3  HCT 39.8 41.0  MCV 87.1 87.2  PLT 370 394    Recent Results (from the past 240 hour(s))  Culture, blood (routine x 2)     Status: None (Preliminary result)   Collection Time: 03/10/18  7:39 PM  Result Value Ref Range Status   Specimen Description BLOOD LEFT ARM  Final   Special Requests   Final    BOTTLES DRAWN AEROBIC AND ANAEROBIC Blood Culture adequate volume   Culture   Final    NO GROWTH < 12 HOURS Performed at [facility], [address] [place], [place] [address]    Report Status PENDING  Incomplete  Culture, blood (routine x 2)     Status: None (Preliminary result)   Collection Time: 03/10/18  7:45 PM  Result Value Ref Range Status    Specimen Description BLOOD RIGHT HAND  Final   Special Requests   Final    BOTTLES DRAWN AEROBIC AND ANAEROBIC Blood Culture adequate volume   Culture   Final    NO GROWTH < 12 HOURS Performed at [facility], [address] [place], [place] [address]    Report Status PENDING  Incomplete  MRSA PCR Screening     Status: Abnormal   Collection Time: 03/11/18  1:57 AM  Result Value Ref Range Status   MRSA by PCR POSITIVE (A) NEGATIVE Final    Comment:        The GeneXpert MRSA Assay (FDA approved for NASAL specimens only), is one component of a comprehensive MRSA colonization surveillance program. It is not intended to diagnose MRSA infection nor to guide or monitor treatment for MRSA infections. RESULT CALLED TO, READ BACK BY AND VERIFIED WITH: LOFTON,M @ 0451 ON 03/11/18 BY JUW Performed at [facility], [address] [place], [place] [address]      Studies: Dg Chest 2 View  Result Date: 03/10/2018 CLINICAL DATA:  Fever EXAM: CHEST - 2 VIEW COMPARISON:  07/26/2012 FINDINGS: Heart and mediastinal contours are within normal limits. No focal opacities or effusions. No acute bony abnormality. IMPRESSION: No active cardiopulmonary disease. Electronically Signed   By: Charlett Nose M.D.   On: 03/10/2018 21:18   Ct Maxillofacial W Contrast  Result Date: 03/10/2018 CLINICAL DATA:  Left facial swelling EXAM: CT MAXILLOFACIAL WITH CONTRAST TECHNIQUE: Multidetector CT imaging of the maxillofacial structures was performed with intravenous contrast. Multiplanar CT image reconstructions were also generated. CONTRAST:  75mL ISOVUE-300 IOPAMIDOL (ISOVUE-300) INJECTION 61% COMPARISON:  None. FINDINGS: Osseous: Poor dentition with multiple caries. Small periapical lucency around left lower first molar. There appears to be erosion of the lateral cortex of the mandible with adjacent soft tissue swelling due to infection in the soft tissues. Chronic nasal bone fracture Orbits: Normal orbit bilaterally  Sinuses: Mild mucosal edema in the sinuses.  No air-fluid level Soft tissues: Extensive soft tissue swelling lateral to the mandible on the left. There is diffuse subcutaneous fatty infiltration as well as edema extending down to the mandible. No fluid collection identified Limited intracranial: Negative IMPRESSION: Extensive soft tissue swelling lateral to the mandible on the left. No drainable abscess in the soft tissues. This appears to be of dental origin due to periapical infection around left lower first molar. Overall poor dentition with multiple caries. Electronically Signed   By: Marlan Palau M.D.   On: 03/10/2018 21:25    Scheduled Meds: . Chlorhexidine Gluconate Cloth  6 each Topical Q0600  . enoxaparin (LOVENOX) injection  40 mg Subcutaneous Q24H  . fluconazole  200 mg Oral Daily  . folic acid  1 mg Oral Daily  . ibuprofen  600 mg Oral TID  . mupirocin ointment  1 application Nasal BID  . thiamine  100 mg Oral Daily   Continuous  Infusions: . sodium chloride 125 mL/hr at 03/10/18 1948    Time spent: 30 minutes   Vassie Loll  [facility] Pager [phone]. If 7PM-7AM, please contact night-coverage at [email or web address], password [facility] 03/11/2018, 1:23 PM  LOS: 1 day

## 2018-03-12 DIAGNOSIS — K029 Dental caries, unspecified: Secondary | ICD-10-CM

## 2018-03-12 DIAGNOSIS — N3090 Cystitis, unspecified without hematuria: Secondary | ICD-10-CM

## 2018-03-12 DIAGNOSIS — Z720 Tobacco use: Secondary | ICD-10-CM

## 2018-03-12 DIAGNOSIS — F1910 Other psychoactive substance abuse, uncomplicated: Secondary | ICD-10-CM

## 2018-03-12 LAB — CBC
HEMATOCRIT: 39.6 % (ref 39.0–52.0)
HEMOGLOBIN: 12.9 g/dL — AB (ref 13.0–17.0)
MCH: 28.4 pg (ref 26.0–34.0)
MCHC: 32.6 g/dL (ref 30.0–36.0)
MCV: 87 fL (ref 78.0–100.0)
Platelets: 344 10*3/uL (ref 150–400)
RBC: 4.55 MIL/uL (ref 4.22–5.81)
RDW: 15.2 % (ref 11.5–15.5)
WBC: 12.5 10*3/uL — ABNORMAL HIGH (ref 4.0–10.5)

## 2018-03-12 LAB — URINE CULTURE

## 2018-03-12 LAB — BASIC METABOLIC PANEL
Anion gap: 6 (ref 5–15)
BUN: 13 mg/dL (ref 6–20)
CALCIUM: 8.5 mg/dL — AB (ref 8.9–10.3)
CHLORIDE: 103 mmol/L (ref 101–111)
CO2: 26 mmol/L (ref 22–32)
Creatinine, Ser: 1.05 mg/dL (ref 0.61–1.24)
GFR calc Af Amer: >60 mL/min (ref >60)
GFR calc non Af Amer: >60 mL/min (ref >60)
GLUCOSE: 160 mg/dL — AB (ref 65–99)
Potassium: 3.9 mmol/L (ref 3.5–5.1)
Sodium: 135 mmol/L (ref 135–145)

## 2018-03-12 MED ORDER — CLINDAMYCIN PHOSPHATE 600 MG/50ML IV SOLN: 600.0000 mg | Freq: Three times a day (TID) | INTRAVENOUS | Status: DC

## 2018-03-12 MED ORDER — METHOCARBAMOL 1000 MG/10ML IJ SOLN
500.0000 mg | Freq: Three times a day (TID) | INTRAVENOUS | Status: DC | PRN
  Filled 2018-03-12: qty 5

## 2018-03-12 MED ORDER — IBUPROFEN 600 MG PO TABS: 600.0000 mg | ORAL_TABLET | Freq: Three times a day (TID) | ORAL | 0 refills | Status: AC

## 2018-03-12 MED ORDER — FLUCONAZOLE 200 MG PO TABS: 200.0000 mg | ORAL_TABLET | Freq: Every day | ORAL | 0 refills | Status: AC

## 2018-03-12 MED ORDER — METHOCARBAMOL 500 MG PO TABS: 500.0000 mg | ORAL_TABLET | Freq: Three times a day (TID) | ORAL | 0 refills | Status: AC | PRN

## 2018-03-12 MED ORDER — CLINDAMYCIN HCL 300 MG PO CAPS: 300.0000 mg | ORAL_CAPSULE | Freq: Three times a day (TID) | ORAL | 0 refills | Status: AC

## 2018-03-12 MED ORDER — HYDROCODONE-ACETAMINOPHEN 5-325 MG PO TABS: 1.0000 | ORAL_TABLET | Freq: Four times a day (QID) | ORAL | 0 refills | Status: AC | PRN

## 2018-03-12 MED ORDER — METHOCARBAMOL 500 MG PO TABS
500.0000 mg | ORAL_TABLET | Freq: Three times a day (TID) | ORAL | Status: DC | PRN
  Administered 2018-03-12: 500 mg via ORAL
  Filled 2018-03-12: qty 1

## 2018-03-12 MED ORDER — CLINDAMYCIN HCL 150 MG PO CAPS
300.0000 mg | ORAL_CAPSULE | Freq: Three times a day (TID) | ORAL | Status: DC
  Administered 2018-03-12: 300 mg via ORAL
  Filled 2018-03-12: qty 2

## 2018-03-12 NOTE — Discharge Summary (Signed)
Physician Discharge Summary  [name] XBM:[id number] DOB: 10-Nov-1965 DOA: 03/10/2018  PCP: Patient, No Pcp Per  Admit date: 03/10/2018 Discharge date: 03/12/2018  Time spent: 35 minutes  Recommendations for Outpatient Follow-up:  Repeat basic metabolic panel to follow electrolytes and renal function Reassess complete resolution of patient cellulitic process Assist with patient journey quitting illicit drugs and tobacco abuse. Repeat CBC to reassess WBCs trend.  Discharge Diagnoses:  Principal Problem:   Facial cellulitis Active Problems:   Acute lower UTI   Polysubstance abuse (HCC)   Cystitis   Dental caries   Tobacco abuse   Discharge Condition: Stable and improved.  At this moment he was asking to be discharged home on oral antibiotics, otherwise he will have left AGAINST MEDICAL ADVICE.  He did not have any further fevers, improvement in the swelling of his face and able to eat and drink without any difficulties.  Appointments to establish care with PCP and also to follow-up with a dentist provided through case manager.  Diet recommendation: Soft diet  Filed Weights   03/10/18 1906 03/10/18 2249  Weight: 102.1 kg (225 lb) 98.1 kg (216 lb 4.3 oz)    History of present illness:  As per H&P written by Dr. Adrian Blackwater on 03/10/2018. [name] is a 52 y.o. male with a history of polysubstance abuse, gout.  Patient seen for facial swelling for the past week.  Swelling is worsening.  Per family, the patient has been picking at his teeth and has a couple broken teeth.  Patient developed fever over the past day and came to the hospital due to feeling bad.  No palliating or provoking factors.  Patient states that he did a line of cocaine and Lortab yesterday.  Denies IV drug use or sharing drug paraphernalia with others.   Emergency Department Course: White count elevated 11.9.  Lactic acid 1.5.  UA significant for large leukocytes and positive nitrites with rare bacteria  and budding yeast.  UDS significant for cocaine opiates.  Hospital Course:  1-facial cellulitis -Patient improved with IV antibiotics and was demanding to be discharged home -He still with some swelling (but significantly improved), still having some pain but manageable. -Patient discharged on clindamycin 300 mg by mouth 3 times a day -As needed pain medications and instructions to use ibuprofen for the next 5 days along with the use of cold compresses -Appointment was set up for case manager to follow-up with her PCP and also a dentist as an outpatient. -No abscess was appreciated on imaging studies or on physical examination. -Patient was nontoxic at discharge.  2-Candida UTI -Patient treated with fluconazole -At discharge no having any dysuria -Advised to keep himself well-hydrated  3-tobacco abuse -I have discussed tobacco cessation with the patient.  I have counseled the patient regarding the negative impacts of continued tobacco use including but not limited to lung cancer, COPD, and cardiovascular disease.  I have discussed alternatives to tobacco and modalities that may help facilitate tobacco cessation including but not limited to biofeedback, hypnosis, and medications.  Total time spent with tobacco counseling was 5 minutes. -nicotine patch prescribed at discharge  4-polysubstance abuse -Cessation counseling was provided -Patient contemplating quitting -Social worker provided resources at discharge for outpatient assistance.  5-muscle spasm -Improved with the use of Robaxin -Short amount Robaxin prescription provided at discharge.  Procedures:  See below for x-ray reports  Consultations:  None  Discharge Exam: Vitals:   03/11/18 2159 03/12/18 0524  BP: 127/75 125/81  Pulse:  66 (!) 59  Resp: 18 18  Temp: 98.5 F (36.9 C) 97.9 F (36.6 C)  SpO2: 98% 99%    General: Afebrile, in no major distress, slightly anxious and asking to be discharged home.  Reports no  nausea, no vomiting, improvement in his facial swelling and pain. Cardiovascular: S1 and S2, no rubs, no gallops, no murmurs, no JVD. Respiratory: Clear to auscultation bilaterally Abdomen: Soft, nontender, nondistended, positive bowel sounds, no guarding Extremities: No edema, no cyanosis or clubbing.  Discharge Instructions   Discharge Instructions    Discharge instructions   Complete by:  As directed    Take medications as prescribed Keep yourself well-hydrated Complete antibiotic therapy as prescribed Follow-up with PCP as instructed Stop smoking, stop alcohol and recreational drug use Take multivitamins on daily basis to provide supplementation of folic acid and thiamine. Follow modified carbohydrates diet Arrange outpatient visit with a dentist.     Allergies as of 03/12/2018      Reactions   Bee Venom Shortness Of Breath, Swelling      Medication List    TAKE these medications   clindamycin 300 MG capsule Commonly known as:  CLEOCIN Take 1 capsule (300 mg total) by mouth every 8 (eight) hours for 10 days.   fluconazole 200 MG tablet Commonly known as:  DIFLUCAN Take 1 tablet (200 mg total) by mouth daily for 3 days. Start taking on:  03/13/2018   HYDROcodone-acetaminophen 5-325 MG tablet Commonly known as:  NORCO Take 1-2 tablets by mouth every 6 (six) hours as needed for up to 5 days for severe pain.   ibuprofen 600 MG tablet Commonly known as:  ADVIL,MOTRIN Take 1 tablet (600 mg total) by mouth 3 (three) times daily for 5 days.   methocarbamol 500 MG tablet Commonly known as:  ROBAXIN Take 1 tablet (500 mg total) by mouth every 8 (eight) hours as needed for muscle spasms.   senna-docusate 8.6-50 MG tablet Commonly known as:  Senokot-S Take 2 tablets by mouth at bedtime. What changed:    when to take this  reasons to take this      Allergies  Allergen Reactions  . Bee Venom Shortness Of Breath and Swelling   Follow-up Information    Health,  [facility] Dept Personal Follow up.   Why:  appointment is Thursday May 23rd at 9:45 bing proof of income for all people in the household and ID Contact information: [address] [place] [address] [phone]        [facility] Follow up.   Why:  appointment is Wednesday May 22nd at 2:45 ARRIVE AT 2:30 bring proof of income and ID Contact information: [place] location [address] [place] [address]           The results of significant diagnostics from this hospitalization (including imaging, microbiology, ancillary and laboratory) are listed below for reference.    Significant Diagnostic Studies: Dg Chest 2 View  Result Date: 03/10/2018 CLINICAL DATA:  Fever EXAM: CHEST - 2 VIEW COMPARISON:  07/26/2012 FINDINGS: Heart and mediastinal contours are within normal limits. No focal opacities or effusions. No acute bony abnormality. IMPRESSION: No active cardiopulmonary disease. Electronically Signed   By: Charlett Nose M.D.   On: 03/10/2018 21:18   Ct Maxillofacial W Contrast  Result Date: 03/10/2018 CLINICAL DATA:  Left facial swelling EXAM: CT MAXILLOFACIAL WITH CONTRAST TECHNIQUE: Multidetector CT imaging of the maxillofacial structures was performed with intravenous contrast. Multiplanar  CT image reconstructions were also generated. CONTRAST:  75mL ISOVUE-300 IOPAMIDOL (ISOVUE-300) INJECTION 61% COMPARISON:  None. FINDINGS: Osseous: Poor dentition with multiple caries. Small periapical lucency around left lower first molar. There appears to be erosion of the lateral cortex of the mandible with adjacent soft tissue swelling due to infection in the soft tissues. Chronic nasal bone fracture Orbits: Normal orbit bilaterally Sinuses: Mild mucosal edema in the sinuses.  No air-fluid level Soft tissues: Extensive soft tissue swelling lateral to the mandible on the left. There is diffuse subcutaneous fatty infiltration as well  as edema extending down to the mandible. No fluid collection identified Limited intracranial: Negative IMPRESSION: Extensive soft tissue swelling lateral to the mandible on the left. No drainable abscess in the soft tissues. This appears to be of dental origin due to periapical infection around left lower first molar. Overall poor dentition with multiple caries. Electronically Signed   By: Marlan Palau M.D.   On: 03/10/2018 21:25    Microbiology: Recent Results (from the past 240 hour(s))  Culture, blood (routine x 2)     Status: None (Preliminary result)   Collection Time: 03/10/18  7:39 PM  Result Value Ref Range Status   Specimen Description BLOOD LEFT ARM  Final   Special Requests   Final    BOTTLES DRAWN AEROBIC AND ANAEROBIC Blood Culture adequate volume   Culture   Final    NO GROWTH 2 DAYS Performed at [facility], [address], [place], [place] [address]    Report Status PENDING  Incomplete  Culture, blood (routine x 2)     Status: None (Preliminary result)   Collection Time: 03/10/18  7:45 PM  Result Value Ref Range Status   Specimen Description BLOOD RIGHT HAND  Final   Special Requests   Final    BOTTLES DRAWN AEROBIC AND ANAEROBIC Blood Culture adequate volume   Culture   Final    NO GROWTH 2 DAYS Performed at [facility], [address] [place], [place] [address]    Report Status PENDING  Incomplete  Urine culture     Status: Abnormal   Collection Time: 03/10/18  7:56 PM  Result Value Ref Range Status   Specimen Description   Final    URINE, CLEAN CATCH Performed at [facility], [address] [place], [place] [address]    Special Requests   Final    NONE Performed at [facility], [address] [place], [place] [address]    Culture MULTIPLE SPECIES PRESENT, SUGGEST RECOLLECTION (A)  Final   Report Status 03/12/2018 FINAL  Final  MRSA PCR Screening     Status: Abnormal   Collection Time: 03/11/18  1:57 AM  Result Value Ref Range Status   MRSA by PCR  POSITIVE (A) NEGATIVE Final    Comment:        The GeneXpert MRSA Assay (FDA approved for NASAL specimens only), is one component of a comprehensive MRSA colonization surveillance program. It is not intended to diagnose MRSA infection nor to guide or monitor treatment for MRSA infections. RESULT CALLED TO, READ BACK BY AND VERIFIED WITH: LOFTON,M @ 0451 ON 03/11/18 BY JUW Performed at [facility], [address], [place], [place] [address]      Labs: Basic Metabolic Panel: Recent Labs  Lab 03/10/18 1937 03/11/18 0614 03/12/18 0927  NA 133* 136 135  K 4.1 3.9 3.9  CL 97* 100* 103  CO2 GLUCOSE 146* 108* 160*  BUN 15 11  13  CREATININE 1.07 0.94 1.05  CALCIUM 8.9 8.8* 8.5*   Liver Function Tests: No results for input(s): AST, ALT, ALKPHOS, BILITOT, PROT, ALBUMIN in the last 168 hours. No results for input(s): LIPASE, AMYLASE in the last 168 hours. No results for input(s): AMMONIA in the last 168 hours. CBC: Recent Labs  Lab 03/10/18 1937 03/11/18 0614 03/12/18 0927  WBC 11.9* 13.1* 12.5*  NEUTROABS 8.9*  --   --   HGB 12.9* 13.3 12.9*  HCT 39.8 41.0 39.6  MCV 87.1 87.2 87.0  PLT 370 394 344    Signed:  Vassie Loll MD.  [facility] 03/12/2018, 12:51 PM

## 2018-03-12 NOTE — Progress Notes (Signed)
Patient discharged home, no IV to removed. D/C with prescriptions and personal belongings.

## 2018-03-12 NOTE — Care Management Note (Addendum)
Case Management Note  Patient Details  Name: [name] MRN: [id number] Date of Birth: 1966-02-06  Subjective/Objective:   Facial cellulitis. No PCP or insurance. Financial counselor to see patient.              Action/Plan:APPT made with [facility] Dept. To establish care and dental clinic for dental care. APPTs explained to patient and added to AVS. Patient gives consent to have DC summary faxed to [facility] Department. Mentioned resources to be given by CSW for  substance abuse. Patient states he did not need or want any  resources for substance abuse.   Expected Discharge Date:  03/12/18               Expected Discharge Plan:  Home/Self Care  In-House Referral:     Discharge planning Services  CM Consult, Follow-up appt scheduled, [facility]  Post Acute Care Choice:  NA Choice offered to:  NA  DME Arranged:    DME Agency:     HH Arranged:    HH Agency:     Status of Service:  Completed, signed off  If discussed at [employer] of Stay Meetings, dates discussed:    Additional Comments:  Aubri Gathright, Chrystine Oiler, RN 03/12/2018, 11:01 AM

## 2018-03-15 LAB — CULTURE, BLOOD (ROUTINE X 2)
CULTURE: NO GROWTH
Culture: NO GROWTH
Special Requests: ADEQUATE
Special Requests: ADEQUATE

## 2021-11-03 ENCOUNTER — Encounter (HOSPITAL_COMMUNITY): Payer: Self-pay

## 2021-11-03 ENCOUNTER — Emergency Department (HOSPITAL_COMMUNITY): Payer: BLUE CROSS/BLUE SHIELD

## 2021-11-03 ENCOUNTER — Emergency Department (HOSPITAL_COMMUNITY)
Admission: EM | Admit: 2021-11-03 | Discharge: 2021-11-03 | Disposition: A | Discharge status: Home / Self Care | Payer: BLUE CROSS/BLUE SHIELD | Attending: Emergency Medicine | Admitting: Emergency Medicine

## 2021-11-03 ENCOUNTER — Other Ambulatory Visit: Payer: Self-pay

## 2021-11-03 DIAGNOSIS — N200 Calculus of kidney: Secondary | ICD-10-CM | POA: Insufficient documentation

## 2021-11-03 DIAGNOSIS — R1032 Left lower quadrant pain: Secondary | ICD-10-CM | POA: Diagnosis present

## 2021-11-03 DIAGNOSIS — R109 Unspecified abdominal pain: Secondary | ICD-10-CM

## 2021-11-03 LAB — CBC WITH DIFFERENTIAL/PLATELET
Abs Immature Granulocytes: 0.02 10*3/uL (ref 0.00–0.07)
Basophils Absolute: 0.1 10*3/uL (ref 0.0–0.1)
Basophils Relative: 1 %
Eosinophils Absolute: 0.1 10*3/uL (ref 0.0–0.5)
Eosinophils Relative: 2 %
HCT: 39.6 % (ref 39.0–52.0)
Hemoglobin: 12.7 g/dL — ABNORMAL LOW (ref 13.0–17.0)
Immature Granulocytes: 0 %
Lymphocytes Relative: 23 %
Lymphs Abs: 2 10*3/uL (ref 0.7–4.0)
MCH: 26 pg (ref 26.0–34.0)
MCHC: 32.1 g/dL (ref 30.0–36.0)
MCV: 81.1 fL (ref 80.0–100.0)
Monocytes Absolute: 0.9 10*3/uL (ref 0.1–1.0)
Monocytes Relative: 10 %
Neutro Abs: 5.5 10*3/uL (ref 1.7–7.7)
Neutrophils Relative %: 64 %
Platelets: 338 10*3/uL (ref 150–400)
RBC: 4.88 MIL/uL (ref 4.22–5.81)
RDW: 14.8 % (ref 11.5–15.5)
WBC: 8.6 10*3/uL (ref 4.0–10.5)
nRBC: 0 % (ref 0.0–0.2)

## 2021-11-03 LAB — URINALYSIS, ROUTINE W REFLEX MICROSCOPIC
Bilirubin Urine: NEGATIVE
Glucose, UA: NEGATIVE mg/dL
Ketones, ur: NEGATIVE mg/dL
Nitrite: POSITIVE — AB
Protein, ur: 30 mg/dL — AB
RBC / HPF: >50 RBC/hpf — ABNORMAL HIGH (ref 0–5)
Specific Gravity, Urine: 1.013 (ref 1.005–1.030)
WBC, UA: >50 WBC/hpf — ABNORMAL HIGH (ref 0–5)
pH: 7 (ref 5.0–8.0)

## 2021-11-03 LAB — BASIC METABOLIC PANEL
Anion gap: 6 (ref 5–15)
BUN: 18 mg/dL (ref 6–20)
CO2: 26 mmol/L (ref 22–32)
Calcium: 8.8 mg/dL — ABNORMAL LOW (ref 8.9–10.3)
Chloride: 105 mmol/L (ref 98–111)
Creatinine, Ser: 1.15 mg/dL (ref 0.61–1.24)
GFR, Estimated: >60 mL/min (ref >60)
Glucose, Bld: 98 mg/dL (ref 70–99)
Potassium: 4.6 mmol/L (ref 3.5–5.1)
Sodium: 137 mmol/L (ref 135–145)

## 2021-11-03 MED ORDER — OXYCODONE-ACETAMINOPHEN 5-325 MG PO TABS: 1.0000 | ORAL_TABLET | Freq: Three times a day (TID) | ORAL | 0 refills | Status: AC | PRN

## 2021-11-03 MED ORDER — HYDROMORPHONE HCL 1 MG/ML IJ SOLN
1.0000 mg | Freq: Once | INTRAMUSCULAR | Status: AC
  Administered 2021-11-03: 1 mg via INTRAVENOUS
  Filled 2021-11-03: qty 1

## 2021-11-03 MED ORDER — CEPHALEXIN 500 MG PO CAPS: 500.0000 mg | ORAL_CAPSULE | Freq: Two times a day (BID) | ORAL | 0 refills | Status: AC

## 2021-11-03 MED ORDER — ONDANSETRON HCL 4 MG/2ML IJ SOLN
4.0000 mg | Freq: Once | INTRAMUSCULAR | Status: AC
  Administered 2021-11-03: 4 mg via INTRAVENOUS
  Filled 2021-11-03: qty 2

## 2021-11-03 MED ORDER — HYDROMORPHONE HCL 1 MG/ML IJ SOLN
1.0000 mg | Freq: Once | INTRAMUSCULAR | Status: DC
  Filled 2021-11-03: qty 1

## 2021-11-03 MED ORDER — ONDANSETRON HCL 4 MG PO TABS: 4.0000 mg | ORAL_TABLET | Freq: Four times a day (QID) | ORAL | 0 refills | Status: AC

## 2021-11-03 MED ORDER — ONDANSETRON HCL 4 MG PO TABS: 4.0000 mg | ORAL_TABLET | Freq: Three times a day (TID) | ORAL | 0 refills | Status: AC | PRN

## 2021-11-03 MED ORDER — KETOROLAC TROMETHAMINE 30 MG/ML IJ SOLN
30.0000 mg | Freq: Once | INTRAMUSCULAR | Status: AC
  Administered 2021-11-03: 30 mg via INTRAVENOUS
  Filled 2021-11-03: qty 1

## 2021-11-03 MED ORDER — CEPHALEXIN 500 MG PO CAPS: 500.0000 mg | ORAL_CAPSULE | Freq: Four times a day (QID) | ORAL | 0 refills | Status: DC

## 2021-11-03 MED ORDER — SODIUM CHLORIDE 0.9 % IV BOLUS
1000.0000 mL | Freq: Once | INTRAVENOUS | Status: AC
  Administered 2021-11-03: 1000 mL via INTRAVENOUS

## 2021-11-03 MED ORDER — ONDANSETRON 4 MG PO TBDP
4.0000 mg | ORAL_TABLET | Freq: Once | ORAL | Status: DC
  Filled 2021-11-03: qty 1

## 2021-11-03 MED ORDER — CEPHALEXIN 500 MG PO CAPS
500.0000 mg | ORAL_CAPSULE | Freq: Once | ORAL | Status: AC
  Administered 2021-11-03: 500 mg via ORAL
  Filled 2021-11-03: qty 1

## 2021-11-03 NOTE — ED Notes (Signed)
Pt with hx of kidney stones.  

## 2021-11-03 NOTE — ED Provider Notes (Signed)
[facility] EMERGENCY DEPARTMENT Provider Note   CSN: [id number] Arrival date & time: 11/03/21  1704     History  Chief Complaint  Patient presents with   Flank Pain    [name] is a 56 y.o. male.  HPI  Patient with medical history including kidney stones, polysubstance use, presents with chief complaint of left-sided flank tenderness, states that it started last night/early this morning, states he has a throbbing-like sensation in his left lower back will go into his lower abdomen, states the pain waxes and wanes in intensity, cannot find comfort, pain is worsened with urination, he endorses decreased urination, denies hematuria dysuria denies penile discharge, testicular pain, has had associated nausea without vomiting, denies any constipation or diarrhea, no fevers, chills, nutrition sore throat, cough, general body aches.  He has had a left urinary stent placed in 2018, as well as had a hernia repair, no other sniffing abdominal history.  Denies any alleviating or having factors. Home Medications Prior to Admission medications   Medication Sig Start Date End Date Taking? Authorizing Provider  ibuprofen (ADVIL) 200 MG tablet Take 400 mg by mouth every 6 (six) hours as needed.   Yes [provider]  indomethacin (INDOCIN) 50 MG capsule Take 50 mg by mouth daily as needed for moderate pain.   Yes [provider]  senna-docusate (SENOKOT-S) 8.6-50 MG tablet Take 2 tablets by mouth at bedtime. 08/01/17  Yes McCormickLenna Sciara, MD  methocarbamol (ROBAXIN) 500 MG tablet Take 1 tablet (500 mg total) by mouth every 8 (eight) hours as needed for muscle spasms. Patient not taking: Reported on 11/03/2021 03/12/18   Barton Dubois, MD      Allergies    Bee venom and Bactrim [sulfamethoxazole-trimethoprim]    Review of Systems   Review of Systems  Constitutional:  Negative for chills and fever.  Respiratory:  Negative for shortness of breath.   Cardiovascular:   Negative for chest pain.  Gastrointestinal:  Negative for abdominal pain.  Genitourinary:  Positive for difficulty urinating and flank pain. Negative for frequency, penile swelling and testicular pain.  Neurological:  Negative for headaches.   Physical Exam Updated Vital Signs BP (!) 172/92    Pulse 75    Temp 97.8 F (36.6 C) (Oral)    Resp 19    Ht 5\' 11"  (1.803 m)    Wt 102.5 kg    SpO2 96%    BMI 31.52 kg/m  Physical Exam Vitals and nursing note reviewed.  Constitutional:      General: He is in acute distress.     Appearance: He is not ill-appearing.  HENT:     Head: Normocephalic and atraumatic.     Nose: No congestion.  Eyes:     Conjunctiva/sclera: Conjunctivae normal.  Cardiovascular:     Rate and Rhythm: Normal rate and regular rhythm.     Pulses: Normal pulses.     Heart sounds: No murmur heard.   No friction rub. No gallop.  Pulmonary:     Effort: No respiratory distress.     Breath sounds: No wheezing, rhonchi or rales.  Abdominal:     Palpations: Abdomen is soft.     Tenderness: There is no abdominal tenderness. There is left CVA tenderness. There is no right CVA tenderness.     Comments: Abdomen nondistended, normal bowel sounds, dull to percussion, abdomen nontender to palpation, had left-sided tenderness, as well as positive left CVA tenderness, there is no guarding, rebound times, peritoneal  sign.  Musculoskeletal:     Right lower leg: No edema.     Left lower leg: No edema.  Skin:    General: Skin is warm and dry.  Neurological:     Mental Status: He is alert.  Psychiatric:        Mood and Affect: Mood normal.    ED Results / Procedures / Treatments   Labs (all labs ordered are listed, but only abnormal results are displayed) Labs Reviewed  URINALYSIS, ROUTINE W REFLEX MICROSCOPIC - Abnormal; Notable for the following components:      Result Value   APPearance HAZY (*)    Hgb urine dipstick LARGE (*)    Protein, ur 30 (*)    Nitrite POSITIVE (*)     Leukocytes,Ua LARGE (*)    RBC / HPF >50 (*)    WBC, UA >50 (*)    Bacteria, UA RARE (*)    All other components within normal limits  CBC WITH DIFFERENTIAL/PLATELET - Abnormal; Notable for the following components:   Hemoglobin 12.7 (*)    All other components within normal limits  BASIC METABOLIC PANEL - Abnormal; Notable for the following components:   Calcium 8.8 (*)    All other components within normal limits  URINE CULTURE    EKG None  Radiology CT Renal Stone Study  Result Date: 11/03/2021 CLINICAL DATA:  Flank pain, kidney stone suspected EXAM: CT ABDOMEN AND PELVIS WITHOUT CONTRAST TECHNIQUE: Multidetector CT imaging of the abdomen and pelvis was performed following the standard protocol without IV contrast. RADIATION DOSE REDUCTION: This exam was performed according to the departmental dose-optimization program which includes automated exposure control, adjustment of the mA and/or kV according to patient size and/or use of iterative reconstruction technique. COMPARISON:  CT renal 06/27/2017, CT abdomen pelvis 02/11/13 FINDINGS: Lower chest: Coronary artery calcifications. Otherwise no acute abnormality. Hepatobiliary: No focal liver abnormality. No gallstones, gallbladder wall thickening, or pericholecystic fluid. No biliary dilatation. Pancreas: No focal lesion. Normal pancreatic contour. No surrounding inflammatory changes. No main pancreatic ductal dilatation. Spleen: Normal in size without focal abnormality. Adrenals/Urinary Tract: No adrenal nodule bilaterally. Asymmetric left greater than right perinephric fat stranding. Left ureteral stent with proximal pigtail within a left renal superior calyx and distal pigtail within the urinary bladder lumen. Slight interval worsening of associated irregular ureteral wall thickening and peri-ureteral fat stranding. Left nephrolithiasis measuring up to 9 mm in the inferior renal pole. No hydronephrosis. There is a 2.4 cm fluid density  lesion along the inferior pole of the left kidney that likely represents a simple renal cyst. No left ureterolithiasis. At least mild left hydroureter. Previously identified left ureteropelvic junction stone no longer visualized. Punctate right nephrolithiasis. No right hydronephrosis. No ureterolithiasis or hydroureter on the right. The urinary bladder is unremarkable. Stomach/Bowel: Stomach is within normal limits. No evidence of bowel wall thickening or dilatation. Scattered colonic diverticulosis. Appendix appears normal. Vascular/Lymphatic: No abdominal aorta or iliac aneurysm. At least mild atherosclerotic plaque of the aorta and its branches. Interval development of a borderline enlarged left periaortic retroperitoneal lymph node: 1 cm (2:35). No abdominal, pelvic, or inguinal lymphadenopathy. Reproductive: Prostate is unremarkable. Other: No intraperitoneal free fluid. No intraperitoneal free gas. No organized fluid collection. Musculoskeletal: No abdominal wall hernia or abnormality. No suspicious lytic or blastic osseous lesions. No acute displaced fracture. Multilevel degenerative changes of the spine. IMPRESSION: 1. Left ureteral stent in appropriate position with associated interval worsening of irregular ureteral wall thickening and peri ureteral fat stranding.  Concern for infection. Recommend correlation with urinalysis. Limited evaluation on this noncontrast study with underlying malignancy not excluded. 2. Interval development of a borderline enlarged left periaortic retroperitoneal lymph. Finding may be reactive in etiology. 3. Nonobstructive 9 mm left nephrolithiasis and punctate right nephrolithiasis. 4. Interval development of a 2.4 cm fluid density lesion along the inferior pole of the left kidney that likely represents a simple renal cyst. Recommend attention on follow-up. 5. Scattered colonic diverticulosis. Electronically Signed   By: Iven Finn M.D.   On: 11/03/2021 19:55     Procedures Procedures    Medications Ordered in ED Medications  sodium chloride 0.9 % bolus 1,000 mL (0 mLs Intravenous Stopped 11/03/21 2015)  HYDROmorphone (DILAUDID) injection 1 mg (1 mg Intravenous Given 11/03/21 1859)  ondansetron (ZOFRAN) injection 4 mg (4 mg Intravenous Given 11/03/21 1859)  HYDROmorphone (DILAUDID) injection 1 mg (1 mg Intravenous Given 11/03/21 1949)  ketorolac (TORADOL) 30 MG/ML injection 30 mg (30 mg Intravenous Given 11/03/21 2048)  HYDROmorphone (DILAUDID) injection 1 mg (1 mg Intravenous Given 11/03/21 2049)    ED Course/ Medical Decision Making/ A&P                           Medical Decision Making  This patient presents to the ED for concern of left-sided flank pain, this involves an extensive number of treatment options, and is a complaint that carries with it a high risk of complications and morbidity.  The differential diagnosis includes kidney stone, [place]    Additional history obtained:  Additional history obtained from electronic medical record External records from outside source obtained and reviewed including previous urology notes   Co morbidities that complicate the patient evaluation  N/A  Social Determinants of Health:  N/A    Lab Tests:  I Ordered, and personally interpreted labs.  The pertinent results include: CBC shows normocytic anemia hemoglobin 12.7 BMP unremarkable, UA shows nitrates, leukocytes, red blood cells white blood cells rare bacteria, urine culture pending   Imaging Studies ordered:  I ordered imaging studies including CT renal I independently visualized and interpreted imaging which showed shows left stent with wall thickening concerning for infection, nonobstructive 9 mm left kidney stone, 2.4 fluid density likely a cyst in left kidney I agree with the radiologist interpretation     Medicines ordered and prescription drug management:  I ordered medication including Dilaudid, Zofran, Toradol for  pain I have reviewed the patients home medicines and have made adjustments as needed   Reevaluation:  After the interventions noted above, I reevaluated the patient and found that they have :improved  Patient was reassessed after pain medication states she is feeling much better, resting comfortably, updated on imaging, concerning for possible intra-abdominal infection will consult with urology for further evaluation  Consultations Obtained:  I requested consultation with spoke with Dr. Diona Fanti,  and discussed lab and imaging findings as well as pertinent plan - they recommend: Recommend urine culture, start patient on antibiotics, Bactrim, follow-up with urology for further evaluation.   Rule out low suspicion for lower lobe pneumonia as lung sounds are clear bilaterally, will defer imaging at this time.  I have low suspicion for liver or gallbladder abnormality as she has no right upper quadrant tenderness.  Low suspicion for ruptured stomach ulcer as she has no peritoneal sign present on exam.  Low suspicion for bowel obstruction as abdomen is nondistended normal bowel sounds, so passing gas and having normal bowel movements.  Low suspicion for complicated diverticulitis as she is nontoxic-appearing, vital signs reassuring no leukocytosis present.  Low suspicion for appendicitis as she has no right lower quadrant tenderness, vital signs reassuring.     Dispostion and problem list  After consideration of the diagnostic results and the patients response to treatment, I feel that the patent would benefit from   Left flank pain-likely from infected stent, will place patient antibiotics,  pain medication, antiemetics, follow-up with urology for further evaluation.  Gave strict return precautions.  Patient is allergy to Bactrim we will place him on Keflex.            Final Clinical Impression(s) / ED Diagnoses Final diagnoses:  None    Rx / DC Orders ED Discharge Orders      None         Marcello Fennel, PA-C 11/03/21 2158    Fredia Sorrow, MD 11/11/21 1035

## 2021-11-03 NOTE — ED Notes (Signed)
Eating saltines and drinking sprite.

## 2021-11-03 NOTE — ED Notes (Signed)
Pt states he is ready for d/c

## 2021-11-03 NOTE — Discharge Instructions (Signed)
You have a infected stent-started you on antibiotics take as prescribed, given you Zofran for nausea, oxycodone for pain please drink plenty fluids.  I have given you a short course of narcotics please take as prescribed.  This medication can make you drowsy do not consume alcohol or operate heavy machinery when taking this medication.  This medication is Tylenol in it do not take Tylenol and take this medication.   Follow-up with urology  Come back to the emergency department if you develop chest pain, shortness of breath, severe abdominal pain, uncontrolled nausea, vomiting, diarrhea.[name]

## 2021-11-03 NOTE — ED Notes (Signed)
Pt ambulated to the bathroom unassisted.  

## 2021-11-03 NOTE — ED Notes (Signed)
Per pa pt was given a fluid challenge and crackers at this time. [name]

## 2021-11-03 NOTE — ED Notes (Signed)
Pt still in pain.

## 2021-11-03 NOTE — ED Triage Notes (Signed)
Pt presents to ED with complaints of left sided flank pain, nausea and vomiting.

## 2021-11-04 MED FILL — Oxycodone w/ Acetaminophen Tab 5-325 MG: ORAL | Qty: 6 | Status: AC

## 2021-11-06 LAB — URINE CULTURE: Culture: >=100000 — AB

## 2021-11-07 ENCOUNTER — Telehealth: Payer: Self-pay | Admitting: Emergency Medicine

## 2021-11-07 NOTE — Telephone Encounter (Signed)
Post ED Visit - Positive Culture Follow-up  Culture report reviewed by antimicrobial stewardship pharmacist: [facility] []  , Pharm.D. []  Enzo Bi, Pharm.D., BCPS AQ-ID []  , Pharm.D., BCPS []  Celedonio Miyamoto, Pharm.D., BCPS []  [place], Garvin Fila.D., BCPS, AAHIVP []  , Pharm.D., BCPS, AAHIVP []  Georgina Pillion, PharmD, BCPS []  , PharmD, BCPS []  [place], PharmD, BCPS [x]  [address], PharmD []  , PharmD, BCPS []  Estella Husk, PharmD  Pharmacy Team []  Lysle Pearl, PharmD []  , PharmD []  Phillips Climes, PharmD []  , Rph []  Agapito Games) , PharmD []  Delmar Landau, PharmD []  , PharmD []  Mervyn Gay, PharmD []  , PharmD []  Vinnie Level, PharmD []  [facility], PharmD []  , PharmD []  Len Childs, PharmD   Positive urine culture Treated with Cephalexin, organism sensitive to the same and no further patient follow-up is required at this time.  [name] 11/07/2021, 4:54 PM
